# Patient Record
Sex: Female | Born: 1965
Health system: Southern US, Community
[De-identification: ages and names within clinical notes are randomized; demographics above are authoritative.]

## PROBLEM LIST (undated history)

## (undated) DIAGNOSIS — K589 Irritable bowel syndrome without diarrhea: Secondary | ICD-10-CM

## (undated) DIAGNOSIS — E559 Vitamin D deficiency, unspecified: Secondary | ICD-10-CM

## (undated) DIAGNOSIS — S82209A Unspecified fracture of shaft of unspecified tibia, initial encounter for closed fracture: Secondary | ICD-10-CM

## (undated) DIAGNOSIS — S82409A Unspecified fracture of shaft of unspecified fibula, initial encounter for closed fracture: Secondary | ICD-10-CM

## (undated) DIAGNOSIS — IMO0002 Reserved for concepts with insufficient information to code with codable children: Principal | ICD-10-CM

## (undated) HISTORY — DX: Reserved for concepts with insufficient information to code with codable children: IMO0002

## (undated) HISTORY — DX: Irritable bowel syndrome, unspecified: K58.9

## (undated) HISTORY — DX: Vitamin D deficiency, unspecified: E55.9

## (undated) HISTORY — PX: OTHER SURGICAL HISTORY: SHX169

## (undated) HISTORY — DX: Unspecified fracture of shaft of unspecified fibula, initial encounter for closed fracture: S82.409A

## (undated) HISTORY — PX: SHOULDER ARTHROSCOPY W/ ROTATOR CUFF REPAIR: SHX2400

## (undated) HISTORY — DX: Unspecified fracture of shaft of unspecified tibia, initial encounter for closed fracture: S82.209A

---
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
## 1986-02-17 HISTORY — PX: RHINOPLASTY: SUR1284

## 1998-05-02 ENCOUNTER — Other Ambulatory Visit: Admission: RE | Admit: 1998-05-02 | Discharge: 1998-05-02 | Payer: Self-pay | Admitting: *Deleted

## 1998-11-06 ENCOUNTER — Inpatient Hospital Stay (HOSPITAL_COMMUNITY): Admission: AD | Admit: 1998-11-06 | Discharge: 1998-11-09 | Payer: Self-pay | Admitting: *Deleted

## 1998-11-11 ENCOUNTER — Encounter (HOSPITAL_COMMUNITY): Admission: RE | Admit: 1998-11-11 | Discharge: 1999-02-09 | Payer: Self-pay | Admitting: *Deleted

## 1998-12-20 ENCOUNTER — Other Ambulatory Visit: Admission: RE | Admit: 1998-12-20 | Discharge: 1998-12-20 | Payer: Self-pay | Admitting: *Deleted

## 2000-06-09 ENCOUNTER — Other Ambulatory Visit: Admission: RE | Admit: 2000-06-09 | Discharge: 2000-06-09 | Payer: Self-pay | Admitting: *Deleted

## 2002-12-22 ENCOUNTER — Other Ambulatory Visit: Admission: RE | Admit: 2002-12-22 | Discharge: 2002-12-22 | Payer: Self-pay | Admitting: Obstetrics and Gynecology

## 2002-12-27 ENCOUNTER — Encounter: Admission: RE | Admit: 2002-12-27 | Discharge: 2002-12-27 | Payer: Self-pay | Admitting: Obstetrics and Gynecology

## 2004-06-17 ENCOUNTER — Ambulatory Visit: Payer: Self-pay | Admitting: Family Medicine

## 2004-07-05 ENCOUNTER — Ambulatory Visit: Payer: Self-pay | Admitting: Family Medicine

## 2005-01-17 ENCOUNTER — Ambulatory Visit: Payer: Self-pay | Admitting: Family Medicine

## 2005-07-28 ENCOUNTER — Ambulatory Visit: Payer: Self-pay | Admitting: Internal Medicine

## 2006-01-13 ENCOUNTER — Ambulatory Visit: Payer: Self-pay | Admitting: Family Medicine

## 2006-01-16 ENCOUNTER — Ambulatory Visit: Payer: Self-pay | Admitting: Family Medicine

## 2006-03-25 ENCOUNTER — Other Ambulatory Visit: Admission: RE | Admit: 2006-03-25 | Discharge: 2006-03-25 | Payer: Self-pay | Admitting: Obstetrics & Gynecology

## 2006-04-20 ENCOUNTER — Ambulatory Visit: Payer: Self-pay | Admitting: Family Medicine

## 2007-02-18 HISTORY — PX: ENDOMETRIAL ABLATION: SHX621

## 2007-03-26 ENCOUNTER — Ambulatory Visit: Payer: Self-pay | Admitting: Family Medicine

## 2007-03-26 LAB — CONVERTED CEMR LAB: Rapid Strep: NEGATIVE

## 2007-03-29 ENCOUNTER — Other Ambulatory Visit: Admission: RE | Admit: 2007-03-29 | Discharge: 2007-03-29 | Payer: Self-pay | Admitting: Obstetrics & Gynecology

## 2007-04-27 ENCOUNTER — Ambulatory Visit (HOSPITAL_BASED_OUTPATIENT_CLINIC_OR_DEPARTMENT_OTHER): Admission: RE | Admit: 2007-04-27 | Discharge: 2007-04-27 | Payer: Self-pay | Admitting: Obstetrics & Gynecology

## 2007-08-13 ENCOUNTER — Ambulatory Visit: Payer: Self-pay | Admitting: Family Medicine

## 2007-10-22 ENCOUNTER — Ambulatory Visit: Payer: Self-pay | Admitting: Family Medicine

## 2007-11-29 ENCOUNTER — Ambulatory Visit: Payer: Self-pay | Admitting: Family Medicine

## 2007-11-29 DIAGNOSIS — R5383 Other fatigue: Secondary | ICD-10-CM | POA: Insufficient documentation

## 2007-11-29 DIAGNOSIS — R5381 Other malaise: Secondary | ICD-10-CM | POA: Insufficient documentation

## 2008-04-12 ENCOUNTER — Other Ambulatory Visit: Admission: RE | Admit: 2008-04-12 | Discharge: 2008-04-12 | Payer: Self-pay | Admitting: Obstetrics & Gynecology

## 2008-04-18 ENCOUNTER — Ambulatory Visit (HOSPITAL_COMMUNITY): Admission: RE | Admit: 2008-04-18 | Discharge: 2008-04-18 | Payer: Self-pay | Admitting: Obstetrics & Gynecology

## 2008-09-14 ENCOUNTER — Ambulatory Visit: Payer: Self-pay | Admitting: Family Medicine

## 2008-09-14 DIAGNOSIS — J019 Acute sinusitis, unspecified: Secondary | ICD-10-CM | POA: Insufficient documentation

## 2009-04-17 LAB — CONVERTED CEMR LAB: Pap Smear: NORMAL

## 2009-11-28 ENCOUNTER — Ambulatory Visit: Payer: Self-pay | Admitting: Family Medicine

## 2009-11-28 LAB — CONVERTED CEMR LAB
ALT: 13 U/L
AST: 19 U/L
Albumin: 4.2 g/dL
Alkaline Phosphatase: 57 U/L
BUN: 16 mg/dL
Basophils Absolute: 0 10*3/uL
Basophils Relative: 0.8 %
Bilirubin, Direct: 0.1 mg/dL
CO2: 30 meq/L
Calcium: 9.9 mg/dL
Chloride: 106 meq/L
Cholesterol: 187 mg/dL
Creatinine, Ser: 0.9 mg/dL
Eosinophils Absolute: 0.2 10*3/uL
Eosinophils Relative: 4.1 %
GFR calc non Af Amer: 68.97 mL/min
Glucose, Bld: 97 mg/dL
HCT: 42.1 %
HDL: 44.6 mg/dL
Hemoglobin: 14.5 g/dL
LDL Cholesterol: 128 mg/dL — ABNORMAL HIGH
Lymphocytes Relative: 34 %
Lymphs Abs: 1.4 10*3/uL
MCHC: 34.4 g/dL
MCV: 92 fL
Monocytes Absolute: 0.4 10*3/uL
Monocytes Relative: 10.2 %
Neutro Abs: 2.1 10*3/uL
Neutrophils Relative %: 50.9 %
Platelets: 257 10*3/uL
Potassium: 4.5 meq/L
RBC: 4.57 M/uL
RDW: 12.6 %
Sodium: 141 meq/L
TSH: 1.2 u[IU]/mL
Total Bilirubin: 0.6 mg/dL
Total CHOL/HDL Ratio: 4
Total Protein: 7.1 g/dL
Triglycerides: 74 mg/dL
VLDL: 14.8 mg/dL
WBC: 4.1 10*3/uL — ABNORMAL LOW

## 2009-12-05 ENCOUNTER — Ambulatory Visit: Payer: Self-pay | Admitting: Family Medicine

## 2010-03-19 NOTE — Assessment & Plan Note (Signed)
Summary: CPX/DLO  R/S FROM 10/19/09   Vital Signs:  Patient profile:   45 year old female Height:      65 inches Weight:      163 pounds BMI:     27.22 Temp:     98.5 degrees F oral Pulse rate:   80 / minute Pulse rhythm:   regular BP sitting:   122 / 80  (left arm) Cuff size:   regular  Vitals Entered By: Lewanda Rife LPN (December 05, 2009 10:26 AM) CC: CPX GYN does pap   History of Present Illness: here for wellness exam and to disc chronic med problems  in general does not feel well - does not know why wonders if she is in menopause  is feeling cold and hot  is jittery and on edge in the ams , then at other times feels very emotional   mother had early menopause in 40s   wt is down 23 lb  had left over valium-- used for anxiety   works out every day   has lost significant weight  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 last -- probably needs one   flu shot - got today   labs all normal  lipids trig 74 and HDL 44 and LDL 128   Allergies: 1)  ! Iodine 2)  ! Codeine 3)  ! Claritin 4)  ! Allegra 5)  ! 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 Eyes:   Denies blurring and eye pain. CV:  Denies chest pain or discomfort, lightheadness, palpitations, shortness of breath with exertion, and swelling of feet. Resp:  Denies cough, shortness of breath, and wheezing. GI:  Denies abdominal pain, bloody stools, change in bowel habits, indigestion, loss of appetite, nausea, and vomiting. GU:  Denies discharge, dysuria, and urinary frequency. MS:  Denies joint pain, joint redness, and joint swelling. Derm:  Denies itching, lesion(s), poor wound healing, and rash. Neuro:  Denies numbness, tingling, and weakness. Psych:  Complains of anxiety, easily tearful, and irritability; denies depression, panic attacks, sense of great danger, and suicidal thoughts/plans. Endo:  Complains of heat intolerance; denies cold intolerance, excessive thirst, and excessive urination. Heme:  Denies abnormal bruising and bleeding.  Physical Exam  General:  Well-developed,well-nourished,in no acute distress; alert,appropriate and cooperative throughout examination Head:  normocephalic, atraumatic, and no abnormalities observed.   Eyes:  vision grossly intact, pupils equal, pupils round, and pupils reactive to light.  no conjunctival pallor, injection or icterus  Ears:  R ear normal and L ear normal.   Nose:  no nasal discharge.   Mouth:  pharynx pink and moist.  Neck:  supple with full rom and no masses or thyromegally, no JVD or carotid bruit  Chest Wall:  No deformities, masses, or tenderness noted. Lungs:  Normal respiratory effort, chest expands symmetrically. Lungs are clear to auscultation, no crackles or wheezes. Heart:  Normal rate and regular rhythm. S1 and S2 normal without gallop, murmur, click, rub or other extra sounds. Abdomen:  Bowel sounds positive,abdomen soft and non-tender without masses, organomegaly or hernias noted. no renal bruits no suprapubic tenderness or fullness felt  Msk:  No deformity or scoliosis noted of thoracic or lumbar spine.  no acute  joint changes  Pulses:  R and L carotid,radial,femoral,dorsalis pedis and posterior tibial pulses are full and equal bilaterally Extremities:  No clubbing, cyanosis, edema, or deformity noted with normal full range of motion of all joints.   Neurologic:  sensation intact to light touch, gait normal, and DTRs symmetrical and normal.   Skin:  Intact without suspicious lesions or rashes lentigos diffusely  Cervical Nodes:  No lymphadenopathy noted Inguinal Nodes:  No significant adenopathy Psych:  normal affect, talkative and pleasant  good eye contact and communication skills not tearful or seemingly anxious   Impression & Recommendations:  Problem # 1:  HEALTH MAINTENANCE EXAM (ICD-V70.0) Assessment Comment Only reviewed health habits including diet, exercise and skin cancer prevention reviewed health maintenance list and family history rev wellness labs in detail  disc low sat fat diet for cholesterol flu shot and Td today  Problem # 2:  FATIGUE (ICD-780.79) Assessment: Deteriorated rev symptoms in detail and with fatigue/ hot flashes/ anx/ mood change and irreg menses suspect perimenopause enc to continue good diet and exercise will f/u with gyn to disc further perhaps OC would be option or ssri-- will think about it   Complete Medication List: 1)  Ibuprofen 200 Mg Tabs (Ibuprofen) .... Otc as directed.  Other Orders: Admin 1st Vaccine (02725) Flu Vaccine 22yrs + [phone])  Patient Instructions: 1)  tetnus shot today  2)  flu shot today  3)  I think you are in "peri menopause"  4)  you can talk to your gyn about it  5)  options include - going on oral contraceptive (with permission of your gyn)  6)  also - for mood change/anxiety -- could consider a daily anxiety medicine like paxil/ buspar or zoloft  7)  let me know what you think 8)  keep up the great health habits    Orders Added: 1)  Admin 1st Vaccine [90471] 2)  Flu Vaccine 56yrs + [90658] 3)  Est. Patient 40-64  years [99396]   Immunizations Administered:  Tetanus Vaccine:    Vaccine Type: Td    Site: right deltoid    Mfr: Sanofi Pasteur    Dose: 0.5 ml    Route: IM    Given by: Lewanda Rife LPN    Exp. Date: 03/21/2011    Lot #: [id number]    VIS given: 01/05/08 version given December 05, 2009.   Immunizations Administered:  Tetanus Vaccine:    Vaccine Type: Td    Site: right deltoid    Mfr: Sanofi Pasteur    Dose: 0.5 ml    Route: IM    Given by: Lewanda Rife LPN    Exp. Date: 03/21/2011    Lot #: [id number]    VIS given: 01/05/08 version given December 05, 2009.  Current Allergies (reviewed today): ! IODINE ! CODEINE ! CLARITIN ! ALLEGRA ! * EPIDURAL PERCOCET Flu Vaccine Consent Questions  Do you have a history of severe allergic reactions to this vaccine? no    Any prior history of allergic reactions to egg and/or gelatin? no    Do you have a sensitivity to the preservative Thimersol? no    Do you have a past history of Guillan-Barre Syndrome? no    Do you currently have an acute febrile illness? no    Have you ever had a severe reaction to latex? no    Vaccine information given and explained to patient? yes    Are you currently pregnant? no    Lot Number:[id number]   Exp Date:08/17/2010   Site Given  Left Deltoid IM Lewanda Rife LPN  December 05, 2009 10:32 AM .lbflu1   Preventive Care Screening  Pap Smear:    Date:  04/17/2009    Results:  normal   Mammogram:    Date:  01/17/2009    Results:  normal      sees GYN

## 2010-07-02 NOTE — Op Note (Signed)
NAME:  Erin Cole, Erin Cole            ACCOUNT NO.:  [id number]   MEDICAL RECORD NO.:  [id number]          PATIENT TYPE:  AMB   LOCATION:  [facility]                         FACILITY:  [facility]   PHYSICIAN:  M. Leda Quail, MD  DATE OF BIRTH:  1965/03/08   DATE OF PROCEDURE:  04/27/2007  DATE OF DISCHARGE:                               OPERATIVE REPORT   PRE OPERATIVE DIAGNOSES  1. A 45 year old G2 P1 married white female with menorrhagia.  2. Small fibroids.  3. History of irritable bowel syndrome.  4. Spouse with history of vasectomy.   POSTOPERATIVE DIAGNOSES:  1. A71 year old G2 P1 married white female with menorrhagia.  2. Small fibroids.  3. History of irritable bowel syndrome.  4. Spouse with history of vasectomy.   PROCEDURE:  Novasure ablation with hysteroscopy.   SURGEON:  M. Leda Quail, MD   ASSISTANT:  OR staff.   ANESTHESIA:  MAC.   FINDINGS:  Normal-appearing cavity with a good ablation postprocedure.   SPECIMENS:  None.   ESTIMATED BLOOD LOSS:  Minimal.   FLUIDS:  1000 mL of LR.   URINE OUTPUT:  50 mL of clear urine drained with a Foley catheter at end  of the procedure.   HYSTEROSCOPIC FLUID DEFICIT:  50 mL of 3% sorbitol; however, there was a  fair amount of this that was actually on the floor.   COMPLICATIONS:  None.   INDICATIONS:  Erin Cole is a very nice 45 year old G2 P1 married  white female with a history of menorrhagia.  She is undergone workup  that included ultrasound showing several small fibroids.  She has been  on birth control pills without much success because of side effects.  She stopped these on her own.  She underwent an endometrial biopsy in  February 2009 which was normal showing benign proliferative endometrium.  She is not interested in [place] IUD and has opted for an ablation.  She  presents for this today.  Risks and benefits have explained her and  documented in our office chart.  Informed consent is present on her  chart today.   PROCEDURE:  The patient is taken to the operating room.  She is placed  in supine position.  MAC anesthesia was administered by anesthesia  without difficulty.  Legs are then positioned in the Brimson stirrups in  the high lithotomy position.  Perineum, inner and thighs and vagina were  prepped in normal sterile fashion.  Red rubber catheters was used to  drain the bladder of all urine.  The patient is then draped in normal  sterile fashion.  A bivalve speculum is placed in the vagina.  The  anterior lip of the cervix is grasped with single-tooth tenaculum.  The  uterus sounds to 10 cm.  The cervix measures at approximately 5 cm.  The  cervix was dilated up to a #5 Hegar dilator.  The 5-mm diagnostic  hysteroscope was passed in the cervical canal.  The cavity of the uterus  well visualized.  Tubal ostia noted and photo documentation is made.  There was no distortion of the cavity from the fibroids or  any other  lesions.  No polyps are noted.  The hysteroscope was removed.  Using  [facility] dilators, the cervix is dilated up to a #8.  A Novasure device is  obtained.  The array is opened outside the patient to ensure that it is  working properly.  A vacuum valve was tapped.  Then the device is passed  through the cervical canal to the fundus of the uterus.  The array is  opened within the patient.  The device is seated by moving it  anteriorly, posteriorly, to the right and to the left and twisting it 90  degrees to the right and left.  This was done several times, the cavity  width was 2.7.  At this point the cavity assessment test was performed  after the cervical seal was placed.  This was passed without difficulty.  Then the ablation cycle was performed.  The ablation cycle lasted 1  minute 57 seconds and the ablation was performed at the power of 74.  With ablation cycle was complete, the array was closed, the device was  removed from the patient.  At a 5-mm hysteroscope was  then by placed  back through the cervical canal and the cavity was visualized.  Good  ablation was performed.  A single-tooth tenaculum was on the anterior  lip of the cervix through the entire procedure to help stabilize the  uterus.  This was removed at this point and no bleeding was noted from  the tenaculum sites.  Please note that at the beginning of the procedure  prior to any dilation of the cervix and after the application of the  tenaculum to the anterior lip of cervix, a paracervical block with 1%  lidocaine without epinephrine was placed.  10 mL in total were placed at  the 3, 6, 9 and 12 o'clock positions on the cervix.  At this point in  the procedure, the patient was awakened from anesthesia.  She was in  stable condition with good pain control and she was taken to the  recovery room in stable condition.      Erin Keas, MD  Electronically Signed     MSM/MEDQ  D:  04/27/2007  T:  04/28/2007  Job:  [id number]

## 2010-11-11 ENCOUNTER — Ambulatory Visit: Payer: Self-pay | Admitting: Internal Medicine

## 2010-11-11 LAB — CBC
HCT: 38.4
Hemoglobin: 13.4
MCHC: 35.1
MCV: 88.5
Platelets: 276
RBC: 4.33
RDW: 12.8
WBC: 10.7 — ABNORMAL HIGH

## 2010-11-11 LAB — URINALYSIS, MICROSCOPIC ONLY
Bilirubin Urine: NEGATIVE
Glucose, UA: NEGATIVE
Ketones, ur: NEGATIVE
Leukocytes, UA: NEGATIVE
Nitrite: NEGATIVE
Protein, ur: NEGATIVE
Specific Gravity, Urine: 1.024
Urobilinogen, UA: 0.2
pH: 5.5

## 2010-11-11 LAB — PREGNANCY, URINE: Preg Test, Ur: NEGATIVE

## 2010-11-29 ENCOUNTER — Ambulatory Visit (INDEPENDENT_AMBULATORY_CARE_PROVIDER_SITE_OTHER): Payer: BC Managed Care – PPO | Admitting: Internal Medicine

## 2010-11-29 ENCOUNTER — Encounter: Payer: Self-pay | Admitting: Internal Medicine

## 2010-11-29 VITALS — BP 121/79 | HR 74 | Temp 98.3°F | Resp 16 | Ht 67.0 in | Wt 169.0 lb

## 2010-11-29 DIAGNOSIS — R5381 Other malaise: Secondary | ICD-10-CM

## 2010-11-29 DIAGNOSIS — Z13220 Encounter for screening for lipoid disorders: Secondary | ICD-10-CM

## 2010-11-29 DIAGNOSIS — R232 Flushing: Secondary | ICD-10-CM

## 2010-11-29 DIAGNOSIS — Z23 Encounter for immunization: Secondary | ICD-10-CM

## 2010-11-29 DIAGNOSIS — D72819 Decreased white blood cell count, unspecified: Secondary | ICD-10-CM

## 2010-11-29 DIAGNOSIS — N951 Menopausal and female climacteric states: Secondary | ICD-10-CM

## 2010-11-29 DIAGNOSIS — R5383 Other fatigue: Secondary | ICD-10-CM

## 2010-11-29 NOTE — Patient Instructions (Signed)
Will are scheduling you for a trasvaginal ultrasound at [facility].  Return to [place] for fasting labs at your convenience
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
## 2010-12-01 ENCOUNTER — Encounter: Payer: Self-pay | Admitting: Internal Medicine

## 2010-12-01 DIAGNOSIS — Z131 Encounter for screening for diabetes mellitus: Secondary | ICD-10-CM | POA: Insufficient documentation

## 2010-12-01 DIAGNOSIS — Z1231 Encounter for screening mammogram for malignant neoplasm of breast: Secondary | ICD-10-CM | POA: Insufficient documentation

## 2010-12-01 DIAGNOSIS — Z13220 Encounter for screening for lipoid disorders: Secondary | ICD-10-CM | POA: Insufficient documentation

## 2010-12-01 NOTE — Assessment & Plan Note (Signed)
She will return for fasting lipids, thyreodi and diabetes screen

## 2011-04-18 ENCOUNTER — Ambulatory Visit: Payer: BC Managed Care – PPO | Admitting: Internal Medicine

## 2011-11-11 ENCOUNTER — Ambulatory Visit (INDEPENDENT_AMBULATORY_CARE_PROVIDER_SITE_OTHER): Payer: BC Managed Care – PPO | Admitting: Internal Medicine

## 2011-11-11 ENCOUNTER — Encounter: Payer: Self-pay | Admitting: Internal Medicine

## 2011-11-11 VITALS — BP 104/62 | HR 84 | Temp 98.7°F | Resp 16 | Wt 173.0 lb

## 2011-11-11 DIAGNOSIS — B3731 Acute candidiasis of vulva and vagina: Secondary | ICD-10-CM | POA: Insufficient documentation

## 2011-11-11 DIAGNOSIS — R232 Flushing: Secondary | ICD-10-CM

## 2011-11-11 DIAGNOSIS — N951 Menopausal and female climacteric states: Secondary | ICD-10-CM

## 2011-11-11 DIAGNOSIS — R5383 Other fatigue: Secondary | ICD-10-CM

## 2011-11-11 DIAGNOSIS — B373 Candidiasis of vulva and vagina: Secondary | ICD-10-CM | POA: Insufficient documentation

## 2011-11-11 DIAGNOSIS — J029 Acute pharyngitis, unspecified: Secondary | ICD-10-CM | POA: Insufficient documentation

## 2011-11-11 DIAGNOSIS — B084 Enteroviral vesicular stomatitis with exanthem: Secondary | ICD-10-CM

## 2011-11-11 DIAGNOSIS — R5381 Other malaise: Secondary | ICD-10-CM

## 2011-11-11 LAB — CBC WITH DIFFERENTIAL/PLATELET
Basophils Absolute: 0 10*3/uL (ref 0.0–0.1)
Basophils Relative: 0.3 % (ref 0.0–3.0)
Eosinophils Absolute: 0 10*3/uL (ref 0.0–0.7)
Eosinophils Relative: 0.4 % (ref 0.0–5.0)
HCT: 41.3 % (ref 36.0–46.0)
Hemoglobin: 13.7 g/dL (ref 12.0–15.0)
Lymphocytes Relative: 9.4 % — ABNORMAL LOW (ref 12.0–46.0)
Lymphs Abs: 0.7 10*3/uL (ref 0.7–4.0)
MCHC: 33.1 g/dL (ref 30.0–36.0)
MCV: 92.1 fl (ref 78.0–100.0)
Monocytes Absolute: 0.8 10*3/uL (ref 0.1–1.0)
Monocytes Relative: 11 % (ref 3.0–12.0)
Neutro Abs: 5.5 10*3/uL (ref 1.4–7.7)
Neutrophils Relative %: 78.9 % — ABNORMAL HIGH (ref 43.0–77.0)
Platelets: 240 10*3/uL (ref 150.0–400.0)
RBC: 4.48 Mil/uL (ref 3.87–5.11)
RDW: 13.2 % (ref 11.5–14.6)
WBC: 7 10*3/uL (ref 4.5–10.5)

## 2011-11-11 LAB — POCT RAPID STREP A (OFFICE): Rapid Strep A Screen: NEGATIVE

## 2011-11-11 MED ORDER — FLUCONAZOLE 150 MG PO TABS: 150.0000 mg | ORAL_TABLET | Freq: Every day | ORAL | Status: DC

## 2011-11-11 MED ORDER — AMOXICILLIN-POT CLAVULANATE 875-125 MG PO TABS: 1.0000 | ORAL_TABLET | Freq: Two times a day (BID) | ORAL | Status: DC

## 2011-11-11 NOTE — Assessment & Plan Note (Signed)
Exam is consistent with early of hand foot mouth disease. Handout given. Continue Tylenol and Motrin as needed. Rapid strep was negative. Asked patient to start Augmentin only if she develops fevers over 100.4, severe facial pain or ear pain, or productive cough with pleuritic chest pain.

## 2011-11-11 NOTE — Addendum Note (Signed)
Addended by: Darletta Moll A on: 11/11/2011 02:00 PM   Modules accepted: Orders

## 2011-11-11 NOTE — Assessment & Plan Note (Signed)
Rapid strep done per patient request due to histoyr of Strep last march and recurrent symptoms.

## 2011-11-11 NOTE — Assessment & Plan Note (Signed)
Empiric fluconazole x2 days prescribed. If symptoms do not resolve she will return for a pelvic exam.

## 2011-11-11 NOTE — Patient Instructions (Signed)
Hand, Foot, and Mouth Disease  Hand, foot, and mouth disease is a common viral illness. It occurs mainly in children younger than 45 years of age, but adolescents and adults may also get it. This disease is different than foot and mouth disease that cattle, sheep, and pigs get. Most people are better in 1 week.  CAUSES   Hand, foot, and mouth disease is usually caused by a group of viruses called enteroviruses. Hand, foot, and mouth disease can spread from person to person (contagious). A person is most contagious during the first week of the illness. It is not transmitted to or from pets or other animals. It is most common in the summer and early fall. Infection is spread from person to person by direct contact with an infected person's:   Nose discharge.   Throat discharge.   Stool.  SYMPTOMS   Open sores (ulcers) occur in the mouth. Symptoms may also include:   A rash on the hands and feet, and occasionally the buttocks.   Fever.   Aches.   Pain from the mouth ulcers.   Fussiness.  DIAGNOSIS   Hand, foot, and mouth disease is one of many infections that cause mouth sores. To be certain your child has hand, foot, and mouth disease your caregiver will diagnose your child by physical exam.Additional tests are not usually needed.  TREATMENT   Nearly all patients recover without medical treatment in 7 to 10 days. There are no common complications. Your child should only take over-the-counter or prescription medicines for pain, discomfort, or fever as directed by your caregiver. Your caregiver may recommend the use of an over-the-counter antacid or a combination of an antacid and diphenhydramine to help coat the lesions in the mouth and improve symptoms.   HOME CARE INSTRUCTIONS   Try combinations of foods to see what your child will tolerate and aim for a balanced diet. Soft foods may be easier to swallow. The mouth sores from hand, foot, and mouth disease typically hurt and are painful when exposed to  salty, spicy, or acidic food or drinks.   Milk and cold drinks are soothing for some patients. Milk shakes, frozen ice pops, slushies, and sherberts are usually well tolerated.   Sport drinks are good choices for hydration, and they also provide a few calories. Often, a child with hand, foot, and mouth disease will be able to drink without discomfort.    For younger children and infants, feeding with a cup, spoon, or syringe may be less painful than drinking through the nipple of a bottle.   Keep children out of childcare programs, schools, or other group settings during the first few days of the illness or until they are without fever. The sores on the body are not contagious.  SEEK IMMEDIATE MEDICAL CARE IF:   Your child develops signs of dehydration such as:   Decreased urination.   Dry mouth, tongue, or lips.   Decreased tears or sunken eyes.   Dry skin.   Rapid breathing.   Fussy behavior.   Poor color or pale skin.   Fingertips taking longer than 2 seconds to turn pink after a gentle squeeze.   Rapid weight loss.   Your child does not have adequate pain relief.   Your child develops a severe headache, stiff neck, or change in behavior.   Your child develops ulcers or blisters that occur on the lips or outside of the mouth.  Document Released: 11/02/2002 Document Revised: 01/23/2011 Document Reviewed: 07/18/2010    LL[employer]. 
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
## 2011-11-12 LAB — COMPREHENSIVE METABOLIC PANEL
ALT: 18 U/L (ref 0–35)
AST: 24 U/L (ref 0–37)
Albumin: 4.3 g/dL (ref 3.5–5.2)
Alkaline Phosphatase: 60 U/L (ref 39–117)
BUN: 12 mg/dL (ref 6–23)
CO2: 25 mEq/L (ref 19–32)
Calcium: 9.4 mg/dL (ref 8.4–10.5)
Chloride: 104 mEq/L (ref 96–112)
Creatinine, Ser: 1 mg/dL (ref 0.4–1.2)
GFR: 67.52 mL/min (ref >60.00)
Glucose, Bld: 89 mg/dL (ref 70–99)
Potassium: 4 mEq/L (ref 3.5–5.1)
Sodium: 138 mEq/L (ref 135–145)
Total Bilirubin: 0.6 mg/dL (ref 0.3–1.2)
Total Protein: 7.5 g/dL (ref 6.0–8.3)

## 2011-11-12 LAB — LIPID PANEL
Cholesterol: 205 mg/dL — ABNORMAL HIGH (ref 0–200)
HDL: 51.5 mg/dL (ref >39.00)
Total CHOL/HDL Ratio: 4
Triglycerides: 87 mg/dL (ref 0.0–149.0)
VLDL: 17.4 mg/dL (ref 0.0–40.0)

## 2011-11-12 LAB — LDL CHOLESTEROL, DIRECT: Direct LDL: 130.7 mg/dL

## 2011-11-12 LAB — LUTEINIZING HORMONE: LH: 53.27 m[IU]/mL

## 2011-11-12 LAB — TSH: TSH: 1.24 u[IU]/mL (ref 0.35–5.50)

## 2011-11-12 LAB — FOLLICLE STIMULATING HORMONE: FSH: 69.3 m[IU]/mL

## 2011-11-17 ENCOUNTER — Telehealth: Payer: Self-pay | Admitting: Internal Medicine

## 2011-11-17 MED ORDER — FLUCONAZOLE 150 MG PO TABS: 150.0000 mg | ORAL_TABLET | Freq: Every day | ORAL | Status: DC

## 2011-11-17 NOTE — Telephone Encounter (Signed)
2 more days of fluconzole sent to pharmacy ,  But if discharge has stopped she does not need any additional after this

## 2011-11-17 NOTE — Telephone Encounter (Signed)
Caller: [name]/Patient; Patient Name: Erin Cole; PCP: Duncan Dull (Adults only); Best Callback Phone Number: [phone]   11-17-11 called this morning requesting a Rx for Diflucan but states hasn't heard anything yet.  I looked in Epic and told her it was in process of being dealt with but nothing ordered yet.

## 2011-11-17 NOTE — Addendum Note (Signed)
Addended by: Duncan Dull on: 11/17/2011 04:58 PM   Modules accepted: Orders

## 2011-11-17 NOTE — Telephone Encounter (Signed)
Caller: Drena/Patient; Patient Name: Erin Cole; PCP: Duncan Dull (Adults only); Best Callback Phone Number: [phone] Temp-  at work no thermometer. Pt states she was in office last week for and dx with Hand foot mouth and vagianl yeast infection. She states that mouth sores are better from Hand foot mouth. She is requesting a prescription for Fluconazole. She reports she developed a fever so she was instructed to start antibiotics if she did start a fever. She took antibiotics Thurs.- Sat and then stopped it because the yeast infection was getting so bad. She used Fluconazole Tues and Wed. She c/o of vaginal  redness,  irritation and some , itching, denies  discharge.  Emergent s/s of of Hand Foot Mouth Dx or suspected protocol r/o. Emergent s/s of Vaginal Discharge or Irritation protocol r/o. Pt to see provider within 24hrs. Offered pt 4:15 appointmnet today. Pt is requesting a message to doctor requesting another prescription for Fluconazole or something for yeast. Pharmacy is [place].

## 2012-07-20 ENCOUNTER — Other Ambulatory Visit: Payer: Self-pay | Admitting: *Deleted

## 2012-07-20 MED ORDER — CONJ ESTROG-MEDROXYPROGEST ACE 0.45-1.5 MG PO TABS: 1.0000 | ORAL_TABLET | Freq: Every day | ORAL | Status: DC

## 2012-07-20 NOTE — Telephone Encounter (Signed)
Faxed refill request received from pharmacy for [facility] Last filled by MD on 07/18/11, 30 X 1 YEAR Last AEX - 07/16/11 Next AEX - 08/12/12 RX sent to last until AEX.

## 2012-08-10 ENCOUNTER — Encounter: Payer: Self-pay | Admitting: Obstetrics & Gynecology

## 2012-08-12 ENCOUNTER — Ambulatory Visit: Payer: Self-pay | Admitting: Obstetrics & Gynecology

## 2012-08-16 ENCOUNTER — Ambulatory Visit (INDEPENDENT_AMBULATORY_CARE_PROVIDER_SITE_OTHER): Payer: BC Managed Care – PPO | Admitting: Obstetrics & Gynecology

## 2012-08-16 ENCOUNTER — Encounter: Payer: Self-pay | Admitting: Obstetrics & Gynecology

## 2012-08-16 VITALS — BP 124/76 | HR 64 | Resp 16 | Ht 65.75 in | Wt 178.6 lb

## 2012-08-16 DIAGNOSIS — Z01419 Encounter for gynecological examination (general) (routine) without abnormal findings: Secondary | ICD-10-CM

## 2012-08-16 DIAGNOSIS — R87619 Unspecified abnormal cytological findings in specimens from cervix uteri: Secondary | ICD-10-CM

## 2012-08-16 DIAGNOSIS — Z124 Encounter for screening for malignant neoplasm of cervix: Secondary | ICD-10-CM

## 2012-08-16 DIAGNOSIS — R87810 Cervical high risk human papillomavirus (HPV) DNA test positive: Secondary | ICD-10-CM

## 2012-08-16 DIAGNOSIS — IMO0002 Reserved for concepts with insufficient information to code with codable children: Secondary | ICD-10-CM

## 2012-08-16 HISTORY — DX: Unspecified abnormal cytological findings in specimens from cervix uteri: R87.619

## 2012-08-16 HISTORY — DX: Reserved for concepts with insufficient information to code with codable children: IMO0002

## 2012-08-16 MED ORDER — ESTRADIOL-NORETHINDRONE ACET 0.05-0.25 MG/DAY TD PTTW: 1.0000 | MEDICATED_PATCH | TRANSDERMAL | Status: DC
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
## 2012-08-16 NOTE — Patient Instructions (Signed)

## 2012-08-17 NOTE — Addendum Note (Signed)
Addended by: Jerene Bears on: 08/17/2012 11:36 AM   Modules accepted: Orders

## 2012-08-23 LAB — IPS PAP TEST WITH HPV

## 2012-08-24 ENCOUNTER — Ambulatory Visit: Payer: Self-pay

## 2012-08-26 NOTE — Addendum Note (Signed)
Addended by: Jerene Bears on: 08/26/2012 02:53 PM   Modules accepted: Orders

## 2012-08-31 ENCOUNTER — Telehealth: Payer: Self-pay | Admitting: *Deleted

## 2012-08-31 LAB — IPS HPV GENOTYPING 16/18

## 2012-08-31 NOTE — Telephone Encounter (Signed)
Message copied by Alisa Graff on Tue Aug 31, 2012  3:44 PM ------      Message from: Jerene Bears      Created: Tue Aug 31, 2012 10:21 AM       Please call.  Pap nl.  hpv positive and, again, a high risk subtype is positive.  Needs colpo.  Pt came early for aex and waited as well so was very unhappy.  If there is anyway appt can be first of morning or first of afternoon appt or end of day, please make it so. ------

## 2012-08-31 NOTE — Telephone Encounter (Signed)
LM on cell number, no specifics.  Call to work number and was told she is out of town this week, no message left.

## 2012-09-01 ENCOUNTER — Telehealth: Payer: Self-pay | Admitting: *Deleted

## 2012-09-01 DIAGNOSIS — IMO0002 Reserved for concepts with insufficient information to code with codable children: Secondary | ICD-10-CM

## 2012-09-01 NOTE — Telephone Encounter (Signed)
Patient notified of pap and HPV results. Discussed that Dr Hyacinth Meeker recommends colpo. Patient agreeable, denies questions as she has had before. Husband with vasectomy.  Colpo scheduled for 09-15-12. Instructed to take Motrin 800 mg 1 hr prior to procedure with food.

## 2012-09-07 NOTE — Telephone Encounter (Signed)
See phone note 09/01/12 

## 2012-09-09 ENCOUNTER — Ambulatory Visit: Payer: Self-pay

## 2012-09-15 ENCOUNTER — Encounter: Payer: Self-pay | Admitting: Obstetrics & Gynecology

## 2012-09-15 ENCOUNTER — Ambulatory Visit (INDEPENDENT_AMBULATORY_CARE_PROVIDER_SITE_OTHER): Payer: BC Managed Care – PPO | Admitting: Obstetrics & Gynecology

## 2012-09-15 VITALS — BP 120/68 | HR 80 | Resp 20 | Ht 65.5 in | Wt 180.0 lb

## 2012-09-15 DIAGNOSIS — R87810 Cervical high risk human papillomavirus (HPV) DNA test positive: Secondary | ICD-10-CM

## 2012-09-15 DIAGNOSIS — B977 Papillomavirus as the cause of diseases classified elsewhere: Secondary | ICD-10-CM

## 2012-09-15 DIAGNOSIS — IMO0002 Reserved for concepts with insufficient information to code with codable children: Secondary | ICD-10-CM

## 2012-09-15 DIAGNOSIS — R6889 Other general symptoms and signs: Secondary | ICD-10-CM

## 2012-09-15 NOTE — Patient Instructions (Signed)

## 2012-09-15 NOTE — Progress Notes (Signed)
Subjective:     Patient ID: Erin Cole, female   DOB: 26-Aug-1965, 47 y.o.   MRN: [id number]  HPI Very nice 47 yo G2P1 MWF with h/o + HR HPV (+16 subtype) and neg Pap, now for two years.  Colpo last year was negative.  ECC negative last year.  Same Pap results obtained this year.  Here for colpo again.  LMP 08/18/10.  Contraception: vasectomy  Review of Systems  All other systems reviewed and are negative.       Objective:   Physical Exam  Genitourinary:       Blood pressure 120/68, pulse 80, resp. rate 20, height 5' 5.5" (1.664 m), weight 180 lb (81.647 kg), last menstrual period 08/18/2010.  Procedure explained and patient's questions were invited and answered.  Consent form signed.   Role of HPV in genesis of SIL discussed with patient, and questions answered.   Speculum inserted atraumatically and cervix visualized.  3% acetic acid applied.  Cervix examined using 3.75 and 7.5   X magnification and green filter.  No lesions noted.  Endocervical TZ noted.  No Lugol's noted due to Iodine allergy.  Cervical dilation of cervix required with Milex dilator.  TZ could be visualized at this point.  ECC obtained.    Patient tolerated procedure well.     Assessment:     Normal Pap with + HR HPV, Subtype 16     Plan:     Plan will be made pending pathology results.  ECC pending.

## 2012-09-15 NOTE — Addendum Note (Signed)
Addended by: Jerene Bears on: 09/15/2012 09:42 AM   Modules accepted: Orders

## 2012-09-17 LAB — IPS CERVICAL/ECC/EMB/VULVAR/VAGINAL BIOPSY

## 2012-09-21 ENCOUNTER — Telehealth: Payer: Self-pay

## 2012-09-21 NOTE — Telephone Encounter (Signed)
8/4 lmtcb//kn 

## 2012-09-21 NOTE — Telephone Encounter (Signed)
Message copied by Elisha Headland on Tue Sep 21, 2012 10:44 AM ------      Message from: Jerene Bears      Created: Fri Sep 17, 2012  2:33 PM       Inform ecc neg.  Repeat pap and hpv 1 yr.  08 recall.  Make sure has appt in one yr, not longer.  Thanks. ------

## 2012-09-21 NOTE — Telephone Encounter (Signed)
Pt returning your call. Says she could barely hear you.

## 2012-09-21 NOTE — Telephone Encounter (Signed)
Patient returning Kelly's call. °

## 2012-10-01 NOTE — Telephone Encounter (Signed)
Patient aware of results. Appointment for next year made and 08 recall in epic.

## 2012-11-08 ENCOUNTER — Ambulatory Visit (INDEPENDENT_AMBULATORY_CARE_PROVIDER_SITE_OTHER): Payer: BC Managed Care – PPO | Admitting: Adult Health

## 2012-11-08 ENCOUNTER — Encounter: Payer: Self-pay | Admitting: Adult Health

## 2012-11-08 VITALS — BP 108/66 | HR 69 | Temp 98.2°F | Resp 12 | Wt 181.0 lb

## 2012-11-08 DIAGNOSIS — J40 Bronchitis, not specified as acute or chronic: Secondary | ICD-10-CM | POA: Insufficient documentation

## 2012-11-08 DIAGNOSIS — J209 Acute bronchitis, unspecified: Secondary | ICD-10-CM

## 2012-11-08 MED ORDER — DOXYCYCLINE MONOHYDRATE 100 MG PO TABS: 100.0000 mg | ORAL_TABLET | Freq: Two times a day (BID) | ORAL | Status: DC

## 2012-11-08 NOTE — Progress Notes (Signed)
  Subjective:    Patient ID: Erin Cole, female    DOB: 04-06-1965, 47 y.o.   MRN: [id number]  HPI  Pt is pleasant 47 yo female, presents to clinic with chest congestion since Thursday. Pt states she initially had sore throat but that only last one day.  Pt states she began having productive cough the next day with yellow sputum.  Pt states she had a low grade fever of 100 on Saturday. Pt states she had some Doxycycline left over from when she had similar symptoms in March so she began taking it Friday night.  Pt states she is beginning to feel better so she thinks the doxycycline is working but she only had enough for 3 days.  Pt states she has been taking Ibuprofen for fever and robitussin for cough which is helping.  Pt also reports bilateral ear pressure, denies facial pain or headache.    Review of Systems  Constitutional: Positive for fever and chills.  HENT: Positive for sore throat.   Eyes: Negative.   Respiratory: Positive for cough.        Productive cough with yellow phlegm.  Cardiovascular: Negative.   Skin: Negative.   Psychiatric/Behavioral: Negative.        Objective:   Physical Exam  Constitutional: She is oriented to person, place, and time. She appears well-developed and well-nourished.  HENT:  Right Ear: Tympanic membrane normal.  Left Ear: Tympanic membrane normal.  Mouth/Throat: Oropharynx is clear and moist and mucous membranes are normal.  Right ear canal slightly erythematous  Neck: Normal range of motion. Neck supple.  Cardiovascular: Normal rate, regular rhythm and normal heart sounds.   Pulmonary/Chest: Effort normal and breath sounds normal. No respiratory distress. She has no wheezes. She has no rales.  Neurological: She is alert and oriented to person, place, and time.  Skin: Skin is warm and dry.  Psychiatric: She has a normal mood and affect. Her behavior is normal. Judgment and thought content normal.    BP 108/66  Pulse 69  Temp(Src) 98.2  F (36.8 C) (Oral)  Resp 12  Wt 181 lb (82.101 kg)  BMI 29.65 kg/m2  SpO2 98%     Assessment & Plan:

## 2012-11-08 NOTE — Assessment & Plan Note (Signed)
Patient had left over doxycycline (3 days worth) from previous illness and began taking this on Friday. Will provide with 4 more days of doxy. RTC if no improvement within 3-4 days. Advised to complete course of antibiotic despite feeling improvement.

## 2012-12-23 ENCOUNTER — Other Ambulatory Visit: Payer: Self-pay

## 2013-02-18 ENCOUNTER — Telehealth: Payer: Self-pay | Admitting: Internal Medicine

## 2013-02-18 MED ORDER — OSELTAMIVIR PHOSPHATE 75 MG PO CAPS: 75.0000 mg | ORAL_CAPSULE | Freq: Every day | ORAL | Status: DC

## 2013-02-18 NOTE — Telephone Encounter (Signed)
[facility] 1 tablet daily x 7 day s

## 2013-02-18 NOTE — Telephone Encounter (Signed)
The patient's daughter and husband have tested positive for the flu . She is wanting Tamiflu called into the pharmacy. The patient currently is not showing any symptoms.

## 2013-02-21 NOTE — Telephone Encounter (Signed)
Pt notified, had picked up Rx Friday.

## 2013-02-23 ENCOUNTER — Telehealth: Payer: Self-pay | Admitting: *Deleted

## 2013-02-23 NOTE — Telephone Encounter (Signed)
Patient husband and daughter has been diagnosed with flu and patient wanted to know if you would give a

## 2013-02-23 NOTE — Telephone Encounter (Signed)
Patient husband and daughter have both been diagnosed with flu patient stated she is not having any symptoms at this time but would like a script for tamiflu please advise.

## 2013-02-23 NOTE — Telephone Encounter (Signed)
Yes, this was done on January 2nd  9see chart) since she is not having any symptoms, the preventive dose of tamiflu is once daily.

## 2013-05-13 ENCOUNTER — Telehealth: Payer: Self-pay | Admitting: Obstetrics & Gynecology

## 2013-05-13 NOTE — Telephone Encounter (Signed)
Patient was to talk about switching her hrt from the patch back to the pill.

## 2013-05-13 NOTE — Telephone Encounter (Signed)
Spoke with patient. Patient has previously been on Prempro but switched to Combipatch. States she has been switching patch locations but it is causing skin irritation. Would like to switch back to Prempro.    Dr.Miller, okay to fill til next annual?

## 2013-05-17 MED ORDER — CONJ ESTROG-MEDROXYPROGEST ACE 0.45-1.5 MG PO TABS: 1.0000 | ORAL_TABLET | Freq: Every day | ORAL | Status: DC

## 2013-05-17 NOTE — Telephone Encounter (Signed)
RX done for prempro 0.45/1.5mg .  #30 with RFs.  Please let us know if this doesn't work well for her.  Thanks.

## 2013-05-17 NOTE — Telephone Encounter (Signed)
Spoke with patient. Advised prescription for prempro 0.45/1.5mg  sent to pharmacy. Instructed to call back to let us know if this change does not work well for her. Patient verbalizes understanding and is agreeable.  Routing to provider for final review. Patient agreeable to disposition. Will close encounter

## 2013-05-30 ENCOUNTER — Ambulatory Visit (INDEPENDENT_AMBULATORY_CARE_PROVIDER_SITE_OTHER): Payer: BC Managed Care – PPO | Admitting: Internal Medicine

## 2013-05-30 ENCOUNTER — Encounter: Payer: Self-pay | Admitting: Internal Medicine

## 2013-05-30 VITALS — BP 120/70 | HR 62 | Temp 98.6°F | Resp 18 | Wt 190.5 lb

## 2013-05-30 DIAGNOSIS — J111 Influenza due to unidentified influenza virus with other respiratory manifestations: Secondary | ICD-10-CM | POA: Insufficient documentation

## 2013-05-30 DIAGNOSIS — R509 Fever, unspecified: Secondary | ICD-10-CM

## 2013-05-30 LAB — POC INFLUENZA A&B (BINAX/QUICKVUE)
INFLUENZA A, POC: NEGATIVE
INFLUENZA B, POC: NEGATIVE

## 2013-05-30 MED ORDER — OSELTAMIVIR PHOSPHATE 75 MG PO CAPS: 75.0000 mg | ORAL_CAPSULE | Freq: Every day | ORAL | Status: DC

## 2013-05-30 MED ORDER — OSELTAMIVIR PHOSPHATE 75 MG PO CAPS: 75.0000 mg | ORAL_CAPSULE | Freq: Two times a day (BID) | ORAL | Status: DC

## 2013-05-30 MED ORDER — PREDNISONE (PAK) 10 MG PO TABS: ORAL_TABLET | ORAL | Status: DC
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
## 2013-05-30 NOTE — Assessment & Plan Note (Addendum)
Despite the negative rapid influenza test,  Will treat based on history, clinical exam and probability.  Adding prednisoen in 2 days if no improvement.

## 2013-05-30 NOTE — Patient Instructions (Addendum)
Your rapid influenza test was negative, but given your exposure and your severe symptoms, I am treating you for the flu with Tamiflue  Start it tonight.   You can use 800 mg ibuprofen every 8 hours and 500 mg tylenol every 8 hours as well for  the achiness and fevers  Add the prednisone and stop the ibuprofen in 24 hours if you don't feel  signficantly better

## 2013-09-22 ENCOUNTER — Ambulatory Visit: Payer: BC Managed Care – PPO | Admitting: Obstetrics & Gynecology

## 2013-10-14 ENCOUNTER — Ambulatory Visit (INDEPENDENT_AMBULATORY_CARE_PROVIDER_SITE_OTHER): Payer: BC Managed Care – PPO | Admitting: Obstetrics and Gynecology

## 2013-10-14 ENCOUNTER — Encounter: Payer: Self-pay | Admitting: Obstetrics and Gynecology

## 2013-10-14 VITALS — BP 118/70 | HR 64 | Resp 18 | Ht 66.0 in | Wt 189.0 lb

## 2013-10-14 DIAGNOSIS — Z0000 Encounter for general adult medical examination without abnormal findings: Secondary | ICD-10-CM

## 2013-10-14 DIAGNOSIS — Z01419 Encounter for gynecological examination (general) (routine) without abnormal findings: Secondary | ICD-10-CM

## 2013-10-14 LAB — POCT URINALYSIS DIPSTICK
Bilirubin, UA: NEGATIVE
Blood, UA: NEGATIVE
Glucose, UA: NEGATIVE
KETONES UA: NEGATIVE
Leukocytes, UA: NEGATIVE
Nitrite, UA: NEGATIVE
PROTEIN UA: NEGATIVE
Urobilinogen, UA: NEGATIVE
pH, UA: 5

## 2013-10-14 MED ORDER — CONJ ESTROG-MEDROXYPROGEST ACE 0.45-1.5 MG PO TABS: 1.0000 | ORAL_TABLET | Freq: Every day | ORAL | Status: DC

## 2013-10-14 NOTE — Patient Instructions (Signed)
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
## 2013-10-20 LAB — IPS PAP TEST WITH HPV

## 2013-11-14 ENCOUNTER — Other Ambulatory Visit: Payer: Self-pay | Admitting: Obstetrics and Gynecology

## 2013-11-14 ENCOUNTER — Telehealth: Payer: Self-pay | Admitting: Obstetrics & Gynecology

## 2013-11-14 MED ORDER — ESTRADIOL 0.5 MG PO TABS: 0.5000 mg | ORAL_TABLET | Freq: Every day | ORAL | Status: DC

## 2013-11-14 MED ORDER — MEDROXYPROGESTERONE ACETATE 2.5 MG PO TABS: 2.5000 mg | ORAL_TABLET | Freq: Every day | ORAL | Status: DC

## 2013-11-14 NOTE — Telephone Encounter (Signed)
I reviewed the patient's preferred list, and Estrace generic oral tabs are Tier I.  I will prescribe for Estradiol 0.5 mg daily.  The Progesterone medications are not listed, but I expect the Provera generic oral tabs are a low Tier as well.  I will prescribe for medroxyprogesterone acetate 2.5 mg daily.   I will put in an order for these until due for the next annual exam.   Patient will need to return for a consultation for Xanax.  No Rx for this in 3 and 1/2 years.   Thanks.

## 2013-11-14 NOTE — Telephone Encounter (Addendum)
Spoke with patient.  We attempted to find hormone information in her formulary that she had available, but unable to assist patient over the phone with this. She will fax what she has of formulary over for review.   Reviewed hard copy of chart. Xanax 0.5 mg po, should not take daily, #30 no refills from Dr. Hyacinth Meeker on 03/30/10.   Received fax from patient with formulary information.  Patient requests medication from Tier one of formulary. Paper chart and copy of faxed formulary to Dr. Edward Jolly (as covering).

## 2013-11-14 NOTE — Telephone Encounter (Signed)
Pt is calling to see if she can change her rx for prempro. Said that it isnt on her formulary. Pt also asking for another rx for xanax. York Spaniel it has been more than a year since she had it. Pt states she mentioned it to the nurse when she was in a few weeks ago but didn't speak with doc about it.

## 2013-11-15 NOTE — Telephone Encounter (Signed)
Message left to return call to Arie Gable at [phone].    

## 2013-11-15 NOTE — Telephone Encounter (Signed)
Spoke with patient. She is given message from Dr. Edward JollySilva.  Advised of new medication and importance to take both estrogen and progesterone daily.   Advised will need appointment with Dr. Hyacinth MeekerMiller for medication refill of xanax. Patient agreeable to this. Scheduled for 12/16/13 at 0830. Routing to provider for final review. Patient agreeable to disposition. Will close encounter

## 2013-12-02 ENCOUNTER — Other Ambulatory Visit: Payer: Self-pay

## 2013-12-08 ENCOUNTER — Encounter: Payer: Self-pay | Admitting: Obstetrics & Gynecology

## 2013-12-08 ENCOUNTER — Ambulatory Visit (INDEPENDENT_AMBULATORY_CARE_PROVIDER_SITE_OTHER): Payer: No Typology Code available for payment source | Admitting: Obstetrics & Gynecology

## 2013-12-08 VITALS — BP 102/60 | HR 68 | Resp 18 | Ht 66.0 in | Wt 193.0 lb

## 2013-12-08 DIAGNOSIS — Z79899 Other long term (current) drug therapy: Secondary | ICD-10-CM

## 2013-12-08 MED ORDER — ALPRAZOLAM 0.5 MG PO TABS: 0.5000 mg | ORAL_TABLET | Freq: Every evening | ORAL | Status: DC | PRN

## 2013-12-11 NOTE — Progress Notes (Signed)
Patient ID: Erin HagemanSusan L Ungerer, female   DOB: 1965-07-05, 48 y.o.   MRN: [id number]  Long term pt of mine who called for xanax rx and appt was made for pt to be seen prior to giving Rx.  I have given pt #30/0RF rx of 0.5mg  Xanax for several years.  She has never asked for a refill in a year and never asks for increased dosage.  Rx given.

## 2013-12-12 ENCOUNTER — Ambulatory Visit: Payer: BC Managed Care – PPO | Admitting: Obstetrics & Gynecology

## 2013-12-16 ENCOUNTER — Ambulatory Visit: Payer: BC Managed Care – PPO | Admitting: Obstetrics & Gynecology

## 2013-12-19 ENCOUNTER — Encounter: Payer: Self-pay | Admitting: Obstetrics & Gynecology

## 2014-06-23 ENCOUNTER — Telehealth: Payer: Self-pay | Admitting: Internal Medicine

## 2014-06-23 ENCOUNTER — Encounter: Payer: Self-pay | Admitting: Family Medicine

## 2014-06-23 ENCOUNTER — Ambulatory Visit (INDEPENDENT_AMBULATORY_CARE_PROVIDER_SITE_OTHER)
Admission: RE | Admit: 2014-06-23 | Discharge: 2014-06-23 | Disposition: A | Discharge status: Home / Self Care | Payer: No Typology Code available for payment source | Source: Ambulatory Visit | Attending: Family Medicine | Admitting: Family Medicine

## 2014-06-23 ENCOUNTER — Ambulatory Visit (INDEPENDENT_AMBULATORY_CARE_PROVIDER_SITE_OTHER): Payer: No Typology Code available for payment source | Admitting: Family Medicine

## 2014-06-23 VITALS — BP 104/68 | HR 55 | Temp 97.6°F | Wt 194.8 lb

## 2014-06-23 DIAGNOSIS — M25531 Pain in right wrist: Secondary | ICD-10-CM

## 2014-06-23 NOTE — Telephone Encounter (Signed)
Patient Name: Erin HackerSUSAN Apodaca  DOB: [birth year]    Initial Comment Caller states she has wrist pain, thinks carpal tunnel.   Nurse Assessment  Nurse: Sherilyn CooterHenry, RN, Thurmond ButtsWade Date/Time Lamount Cohen(Eastern Time): 06/23/2014 8:13:21 AM  Confirm and document reason for call. If symptomatic, describe symptoms. ---Caller states that she has right wrist pain which began Wednesday night. The pain is constant. She rates her pain as 7 on 0-10 scale. She denies numbness in her hand, fingers, or going up her arm.  Has the patient traveled out of the country within the last 30 days? ---No  Does the patient require triage? ---Yes  Related visit to physician within the last 2 weeks? ---No  Does the PT have any chronic conditions? (i.e. diabetes, asthma, etc.) ---No  Did the patient indicate they were pregnant? ---No     Guidelines    Guideline Title Affirmed Question Affirmed Notes  Hand and Wrist Pain Swollen joint of new onset Swelling of fingers, couldn't get her ring on today   Final Disposition User   See PCP When Office is Open (within 3 days) Sherilyn CooterHenry, RN, Thurmond ButtsWade    Comments  [facility] at 11:45 with Dr. Crawford GivensGraham Duncan

## 2014-06-23 NOTE — Progress Notes (Signed)
Pre visit review using our clinic review tool, if applicable. No additional management support is needed unless otherwise documented below in the visit note.  Pain for 2 days.  R wrist pain.  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  Pain along the flexor side of wrist, pain in the R 1st two fingers.  No trauma.   Types a lot at work.  R handed.  Grip is still okay.  Pain but not tingling.  Taking tylenol for pain.   Pain is some better today, has been resting the joint.   Meds, vitals, and allergies reviewed.   ROS: See HPI.  Otherwise, noncontributory.  nad R wrist with normal inspection, not bruised.  No erythema.   R snuff box ttp, slightly R finklestein positive.  phalen and tinel neg Distally NV intact w/o tendon failure in the fingers.

## 2014-06-23 NOTE — Patient Instructions (Signed)
Use the brace for now, ice/tylenol/ibuprofen (with food) as needed.  Gradually wean out of the brace.  We'll contact you with your xray report. Likely tendonitis.  Take care.  Glad to see you.

## 2014-06-23 NOTE — Telephone Encounter (Signed)
FYI

## 2014-06-25 NOTE — Assessment & Plan Note (Signed)
Likely tendonitis.  Splinted in spica, xray neg.  F/u prn.  Routed to PCP as FYI.  See AVS.

## 2014-11-30 ENCOUNTER — Ambulatory Visit (INDEPENDENT_AMBULATORY_CARE_PROVIDER_SITE_OTHER): Payer: No Typology Code available for payment source | Admitting: Obstetrics & Gynecology

## 2014-11-30 ENCOUNTER — Encounter: Payer: Self-pay | Admitting: Obstetrics & Gynecology

## 2014-11-30 VITALS — BP 116/62 | HR 60 | Resp 25 | Ht 65.5 in | Wt 199.0 lb

## 2014-11-30 DIAGNOSIS — Z0000 Encounter for general adult medical examination without abnormal findings: Secondary | ICD-10-CM | POA: Diagnosis not present

## 2014-11-30 DIAGNOSIS — Z124 Encounter for screening for malignant neoplasm of cervix: Secondary | ICD-10-CM

## 2014-11-30 DIAGNOSIS — Z01419 Encounter for gynecological examination (general) (routine) without abnormal findings: Secondary | ICD-10-CM

## 2014-11-30 LAB — COMPREHENSIVE METABOLIC PANEL
ALK PHOS: 69 U/L (ref 33–115)
ALT: 22 U/L (ref 6–29)
AST: 21 U/L (ref 10–35)
Albumin: 4.4 g/dL (ref 3.6–5.1)
BUN: 16 mg/dL (ref 7–25)
CALCIUM: 10 mg/dL (ref 8.6–10.2)
CO2: 28 mmol/L (ref 20–31)
Chloride: 104 mmol/L (ref 98–110)
Creat: 0.87 mg/dL (ref 0.50–1.10)
Glucose, Bld: 88 mg/dL (ref 65–99)
POTASSIUM: 4.5 mmol/L (ref 3.5–5.3)
Sodium: 138 mmol/L (ref 135–146)
TOTAL PROTEIN: 7.2 g/dL (ref 6.1–8.1)
Total Bilirubin: 0.6 mg/dL (ref 0.2–1.2)

## 2014-11-30 LAB — POCT URINALYSIS DIPSTICK
BILIRUBIN UA: NEGATIVE
Glucose, UA: NEGATIVE
Ketones, UA: NEGATIVE
LEUKOCYTES UA: NEGATIVE
NITRITE UA: NEGATIVE
Protein, UA: NEGATIVE
Urobilinogen, UA: NEGATIVE
pH, UA: 5

## 2014-11-30 LAB — VITAMIN D 25 HYDROXY (VIT D DEFICIENCY, FRACTURES): VIT D 25 HYDROXY: 33 ng/mL (ref 30–100)

## 2014-11-30 LAB — TSH: TSH: 1.998 u[IU]/mL (ref 0.350–4.500)

## 2014-11-30 LAB — LIPID PANEL
CHOL/HDL RATIO: 5 ratio (ref <=5.0)
CHOLESTEROL: 215 mg/dL — AB (ref 125–200)
HDL: 43 mg/dL — AB (ref >=46)
LDL Cholesterol: 148 mg/dL — ABNORMAL HIGH (ref <130)
Triglycerides: 122 mg/dL (ref <150)
VLDL: 24 mg/dL (ref <30)

## 2014-11-30 LAB — HEMOGLOBIN, FINGERSTICK: Hemoglobin, fingerstick: 14.1 g/dL (ref 12.0–16.0)

## 2014-11-30 MED ORDER — MEDROXYPROGESTERONE ACETATE 2.5 MG PO TABS: 2.5000 mg | ORAL_TABLET | Freq: Every day | ORAL | Status: DC

## 2014-11-30 MED ORDER — ALPRAZOLAM 0.5 MG PO TABS: 0.5000 mg | ORAL_TABLET | Freq: Every evening | ORAL | Status: DC | PRN

## 2014-11-30 MED ORDER — ESTRADIOL 0.5 MG PO TABS: 0.5000 mg | ORAL_TABLET | Freq: Every day | ORAL | Status: DC
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
## 2014-12-05 LAB — IPS PAP TEST WITH HPV

## 2014-12-07 ENCOUNTER — Telehealth: Payer: Self-pay

## 2014-12-07 NOTE — Telephone Encounter (Signed)
Lmtcb//kn 

## 2014-12-07 NOTE — Telephone Encounter (Signed)
Patient returning your call.

## 2014-12-07 NOTE — Telephone Encounter (Signed)
Patient notified of results.//kn 

## 2014-12-07 NOTE — Telephone Encounter (Signed)
-----   Message from Jerene BearsMary S Miller, MD sent at 12/06/2014  3:38 PM EDT ----- Inform pap and HR HPV were negative.  If in recall, ok to remove.  She is back to routine screening. 02 recall.  Inform BV noted on pap.  If symptomatic, ok to treat with metrogel 0.75% one applicator qhs x 5 nights or flagyl 500mg  bid x 7 days.

## 2015-02-13 ENCOUNTER — Telehealth: Payer: Self-pay | Admitting: Emergency Medicine

## 2015-02-13 DIAGNOSIS — Z1239 Encounter for other screening for malignant neoplasm of breast: Secondary | ICD-10-CM

## 2015-02-13 NOTE — Telephone Encounter (Signed)
Call to patient with message from Dr. Hyacinth MeekerMiller. Patient aware of need.  Offered to schedule Mammogram appointment for her, she agrees. Will call back with appointment.   Appointment obtained for 02/21/15 at 0800 [facility]. Patient agreeable.  Will call back with any concerns.  Routing to provider for final review. Patient agreeable to disposition. Will close encounter.

## 2015-02-13 NOTE — Telephone Encounter (Signed)
-----   Message from Jerene BearsMary S Miller, MD sent at 02/09/2015  2:02 PM EST ----- Regarding: MMG Please call pt.  She has not gone for her MMG appt.  She needs to do this.  She is on HRT and this is very important.  Last one was 2014.  Can you please let her know that I'm still waiting for her to complete her MMG?  Thanks.  Please open phone note.  Thanks.  MSM

## 2015-02-21 ENCOUNTER — Ambulatory Visit
Admission: RE | Admit: 2015-02-21 | Discharge: 2015-02-21 | Disposition: A | Discharge status: Home / Self Care | Payer: BLUE CROSS/BLUE SHIELD | Source: Ambulatory Visit | Attending: Obstetrics & Gynecology | Admitting: Obstetrics & Gynecology

## 2015-02-21 DIAGNOSIS — Z1231 Encounter for screening mammogram for malignant neoplasm of breast: Secondary | ICD-10-CM | POA: Diagnosis not present

## 2015-02-21 DIAGNOSIS — Z1239 Encounter for other screening for malignant neoplasm of breast: Secondary | ICD-10-CM

## 2015-09-24 ENCOUNTER — Ambulatory Visit (INDEPENDENT_AMBULATORY_CARE_PROVIDER_SITE_OTHER): Payer: BLUE CROSS/BLUE SHIELD | Admitting: Obstetrics & Gynecology

## 2015-09-24 ENCOUNTER — Encounter: Payer: Self-pay | Admitting: Obstetrics & Gynecology

## 2015-09-24 VITALS — BP 90/58 | HR 58 | Resp 16 | Ht 66.0 in | Wt 201.8 lb

## 2015-09-24 DIAGNOSIS — Z79890 Hormone replacement therapy (postmenopausal): Secondary | ICD-10-CM

## 2015-09-24 DIAGNOSIS — IMO0002 Reserved for concepts with insufficient information to code with codable children: Secondary | ICD-10-CM

## 2015-09-24 DIAGNOSIS — E668 Other obesity: Secondary | ICD-10-CM
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
## 2015-09-25 ENCOUNTER — Encounter: Payer: Self-pay | Admitting: Obstetrics & Gynecology

## 2015-09-25 DIAGNOSIS — IMO0002 Reserved for concepts with insufficient information to code with codable children: Secondary | ICD-10-CM | POA: Insufficient documentation

## 2015-10-10 ENCOUNTER — Other Ambulatory Visit: Payer: Self-pay

## 2015-10-10 NOTE — Telephone Encounter (Signed)
Medication refill request: Alprazolam 0.5mg  Last AEX:  11/30/14 SM Next AEX: 03/27/16 Last MMG (if hormonal medication request): 02/21/15 BIRADS1 negative Refill authorized: 11/30/14 #30 w/1 refills; today please advise

## 2015-10-12 MED ORDER — ALPRAZOLAM 0.5 MG PO TABS: 0.5000 mg | ORAL_TABLET | Freq: Every evening | ORAL | 0 refills | Status: DC | PRN

## 2015-11-28 ENCOUNTER — Telehealth: Payer: Self-pay | Admitting: Obstetrics & Gynecology

## 2015-11-28 MED ORDER — ESTRADIOL 0.5 MG PO TABS: 0.5000 mg | ORAL_TABLET | Freq: Every day | ORAL | 3 refills | Status: DC

## 2015-11-28 MED ORDER — MEDROXYPROGESTERONE ACETATE 2.5 MG PO TABS: 2.5000 mg | ORAL_TABLET | Freq: Every day | ORAL | 3 refills | Status: DC

## 2015-11-28 NOTE — Telephone Encounter (Addendum)
Patient would like to restart her HRT prescriptions. Patient said that she discussed this with Dr.Miller at her last appointment. Confirmed Pharmacy on file. Patient said no need to call her, just call in the prescriptions unless you have questions.

## 2015-11-28 NOTE — Telephone Encounter (Signed)
Spoke with patient. Patient requesting refill on HRT -Provera 2.5mg  daily and Estrace 0.5mg  daily. Patient was in for OV with Dr. Hyacinth MeekerMiller 09/24/15 for HRT. Patient stopped taking HRT after that visit. Patient reports she "just doesn't feel well" and is having "horrible night sweats". Patient states she thought she would like to try coming off since she had been on them for 9 years, but feels she needs to go back on the HRT. Patient states per last OV Dr.Miller advised she could restart them at anytime if she decided to. Advised patient Dr. Hyacinth MeekerMiller is out of the office, would review with covering provider for recommendations and return call.  Patient is agreeable.   Dr. Edward JollySilva, please advise on refills?   CC: Dr. Hyacinth MeekerMiller

## 2015-11-28 NOTE — Telephone Encounter (Signed)
Spoke with patient. Advised refills for Estradiol 0.5mg  daily #30/3RF and Provera 2.5mg  #30/3RF have been placed as recommended per Dr. Edward JollySilva. Will see Dr. Hyacinth MeekerMiller 03/27/16 for AEX. Patient verbalizes understanding and is agreeable.    Routing to provider for final review. Patient is agreeable to disposition. Will close encounter.  CC: Dr. Hyacinth MeekerMiller

## 2015-11-28 NOTE — Telephone Encounter (Signed)
Ok to return to Estradiol 0.5 mg daily and Provera 2.5 mg daily until annual exam is due in February 2018.   Cc- Dr. Hyacinth MeekerMiller

## 2016-02-08 DIAGNOSIS — M7541 Impingement syndrome of right shoulder: Secondary | ICD-10-CM | POA: Diagnosis not present

## 2016-02-08 DIAGNOSIS — M25511 Pain in right shoulder: Secondary | ICD-10-CM | POA: Diagnosis not present

## 2016-02-25 DIAGNOSIS — M7541 Impingement syndrome of right shoulder: Secondary | ICD-10-CM | POA: Diagnosis not present

## 2016-03-03 DIAGNOSIS — M7541 Impingement syndrome of right shoulder: Secondary | ICD-10-CM | POA: Diagnosis not present

## 2016-03-10 DIAGNOSIS — M7541 Impingement syndrome of right shoulder: Secondary | ICD-10-CM | POA: Diagnosis not present

## 2016-03-12 DIAGNOSIS — M7541 Impingement syndrome of right shoulder: Secondary | ICD-10-CM | POA: Diagnosis not present

## 2016-03-12 DIAGNOSIS — M25511 Pain in right shoulder: Secondary | ICD-10-CM | POA: Diagnosis not present

## 2016-03-24 DIAGNOSIS — M7541 Impingement syndrome of right shoulder: Secondary | ICD-10-CM | POA: Diagnosis not present
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
## 2016-03-27 ENCOUNTER — Encounter: Payer: Self-pay | Admitting: Obstetrics & Gynecology

## 2016-03-27 ENCOUNTER — Ambulatory Visit (INDEPENDENT_AMBULATORY_CARE_PROVIDER_SITE_OTHER): Payer: BLUE CROSS/BLUE SHIELD | Admitting: Obstetrics & Gynecology

## 2016-03-27 VITALS — BP 120/70 | HR 62 | Resp 12 | Ht 65.5 in | Wt 198.8 lb

## 2016-03-27 DIAGNOSIS — Z01419 Encounter for gynecological examination (general) (routine) without abnormal findings: Secondary | ICD-10-CM | POA: Diagnosis not present

## 2016-03-27 DIAGNOSIS — Z79890 Hormone replacement therapy (postmenopausal): Secondary | ICD-10-CM | POA: Diagnosis not present

## 2016-03-27 DIAGNOSIS — Z0000 Encounter for general adult medical examination without abnormal findings: Secondary | ICD-10-CM | POA: Diagnosis not present

## 2016-03-27 DIAGNOSIS — E049 Nontoxic goiter, unspecified: Secondary | ICD-10-CM | POA: Diagnosis not present

## 2016-03-27 LAB — LIPID PANEL
CHOL/HDL RATIO: 4.8 ratio (ref <5.0)
CHOLESTEROL: 226 mg/dL — AB (ref <200)
HDL: 47 mg/dL — AB (ref >50)
LDL Cholesterol: 157 mg/dL — ABNORMAL HIGH (ref <100)
Triglycerides: 112 mg/dL (ref <150)
VLDL: 22 mg/dL (ref <30)

## 2016-03-27 LAB — POCT URINALYSIS DIPSTICK
BILIRUBIN UA: NEGATIVE
Glucose, UA: NEGATIVE
KETONES UA: NEGATIVE
Leukocytes, UA: NEGATIVE
Nitrite, UA: NEGATIVE
PH UA: 5
Protein, UA: NEGATIVE
Urobilinogen, UA: NEGATIVE

## 2016-03-27 LAB — COMPREHENSIVE METABOLIC PANEL
ALT: 18 U/L (ref 6–29)
AST: 19 U/L (ref 10–35)
Albumin: 4.6 g/dL (ref 3.6–5.1)
Alkaline Phosphatase: 79 U/L (ref 33–115)
BUN: 17 mg/dL (ref 7–25)
CALCIUM: 10.1 mg/dL (ref 8.6–10.2)
CHLORIDE: 107 mmol/L (ref 98–110)
CO2: 28 mmol/L (ref 20–31)
Creat: 1.09 mg/dL (ref 0.50–1.10)
GLUCOSE: 81 mg/dL (ref 65–99)
POTASSIUM: 4.5 mmol/L (ref 3.5–5.3)
Sodium: 142 mmol/L (ref 135–146)
TOTAL PROTEIN: 7.5 g/dL (ref 6.1–8.1)
Total Bilirubin: 0.5 mg/dL (ref 0.2–1.2)

## 2016-03-27 LAB — CBC
HEMATOCRIT: 42.2 % (ref 35.0–45.0)
Hemoglobin: 14 g/dL (ref 11.7–15.5)
MCH: 29.6 pg (ref 27.0–33.0)
MCHC: 33.2 g/dL (ref 32.0–36.0)
MCV: 89.2 fL (ref 80.0–100.0)
MPV: 10.9 fL (ref 7.5–12.5)
PLATELETS: 302 10*3/uL (ref 140–400)
RBC: 4.73 MIL/uL (ref 3.80–5.10)
RDW: 13.5 % (ref 11.0–15.0)
WBC: 4.9 10*3/uL (ref 3.8–10.8)

## 2016-03-27 LAB — TSH: TSH: 2.19 mIU/L

## 2016-03-27 LAB — VITAMIN D 25 HYDROXY (VIT D DEFICIENCY, FRACTURES): VIT D 25 HYDROXY: 28 ng/mL — AB (ref 30–100)

## 2016-03-27 MED ORDER — MEDROXYPROGESTERONE ACETATE 2.5 MG PO TABS: 2.5000 mg | ORAL_TABLET | Freq: Every day | ORAL | 4 refills | Status: DC

## 2016-03-27 MED ORDER — ESTRADIOL 0.5 MG PO TABS: 0.5000 mg | ORAL_TABLET | Freq: Every day | ORAL | 4 refills | Status: DC

## 2016-03-27 MED ORDER — ALPRAZOLAM 0.5 MG PO TABS: 0.5000 mg | ORAL_TABLET | Freq: Every evening | ORAL | 0 refills | Status: DC | PRN

## 2016-03-31 ENCOUNTER — Telehealth: Payer: Self-pay

## 2016-03-31 ENCOUNTER — Other Ambulatory Visit: Payer: Self-pay | Admitting: Obstetrics & Gynecology

## 2016-03-31 DIAGNOSIS — Z803 Family history of malignant neoplasm of breast: Secondary | ICD-10-CM

## 2016-03-31 DIAGNOSIS — E049 Nontoxic goiter, unspecified: Secondary | ICD-10-CM

## 2016-03-31 DIAGNOSIS — Z1231 Encounter for screening mammogram for malignant neoplasm of breast: Secondary | ICD-10-CM

## 2016-03-31 NOTE — Telephone Encounter (Signed)
Left message to call Genieve Ramaswamy at [phone]. 

## 2016-03-31 NOTE — Telephone Encounter (Addendum)
-----   Message from Jerene BearsMary S Miller, MD sent at 03/30/2016  3:38 PM EST ----- Regarding: orders Pt has enlarged thyroid at AEX on Thursday.  She needs a thyroid ultrasound.  Also, her mother has hx of breast cancer in 1 breast and then years later in the other breast.  I did a risk calculation on her and she should qualify for every other year MRI.  Her MMG is due now.  Please schedule for her a 3D and then after this is done, I will order the MRI and see what happens with coverage.  When she goes for her MMG, she needs to make sure they understand her mother had breast cancer twice in each breast, not a single cancer with recurrence.  Thanks.

## 2016-03-31 NOTE — Telephone Encounter (Signed)
Spoke with patient. Patient states she is available any time next week or after in the morning for thyroid ultrasound and mammogram. Advised I will call to schedule both appointments and return call with appointment dates and times. Patient is agreeable.   Call to [facility]. Screening mammogram [facility] for [facility]/14/2016 at [facility]. Thyroid ultrasound scheduled for 04/14/2016 at 11:10 am [facility] at [address] Patient has been notified of appointment dates and times and is agreeable. Placed in imaging hold.  Routing to provider for final review. Patient agreeable to disposition. Will close encounter.

## 2016-04-07 ENCOUNTER — Other Ambulatory Visit: Payer: BLUE CROSS/BLUE SHIELD

## 2016-04-14 ENCOUNTER — Ambulatory Visit
Admission: RE | Admit: 2016-04-14 | Discharge: 2016-04-14 | Disposition: A | Discharge status: Home / Self Care | Payer: BLUE CROSS/BLUE SHIELD | Source: Ambulatory Visit | Attending: Obstetrics & Gynecology | Admitting: Obstetrics & Gynecology

## 2016-04-14 ENCOUNTER — Ambulatory Visit: Payer: BLUE CROSS/BLUE SHIELD

## 2016-04-14 DIAGNOSIS — Z803 Family history of malignant neoplasm of breast: Secondary | ICD-10-CM

## 2016-04-14 DIAGNOSIS — Z1231 Encounter for screening mammogram for malignant neoplasm of breast: Secondary | ICD-10-CM | POA: Diagnosis not present

## 2016-04-14 DIAGNOSIS — E049 Nontoxic goiter, unspecified: Secondary | ICD-10-CM

## 2016-04-14 DIAGNOSIS — E042 Nontoxic multinodular goiter: Secondary | ICD-10-CM | POA: Diagnosis not present

## 2016-07-03 DIAGNOSIS — M7541 Impingement syndrome of right shoulder: Secondary | ICD-10-CM | POA: Diagnosis not present

## 2016-07-12 DIAGNOSIS — M7541 Impingement syndrome of right shoulder: Secondary | ICD-10-CM | POA: Diagnosis not present

## 2016-07-21 DIAGNOSIS — M7541 Impingement syndrome of right shoulder: Secondary | ICD-10-CM | POA: Diagnosis not present

## 2016-07-21 DIAGNOSIS — M19011 Primary osteoarthritis, right shoulder: Secondary | ICD-10-CM | POA: Diagnosis not present

## 2016-07-21 DIAGNOSIS — S46011D Strain of muscle(s) and tendon(s) of the rotator cuff of right shoulder, subsequent encounter: Secondary | ICD-10-CM | POA: Diagnosis not present

## 2016-08-15 DIAGNOSIS — G8918 Other acute postprocedural pain: Secondary | ICD-10-CM | POA: Diagnosis not present

## 2016-08-15 DIAGNOSIS — M19011 Primary osteoarthritis, right shoulder: Secondary | ICD-10-CM | POA: Diagnosis not present

## 2016-08-15 DIAGNOSIS — M24111 Other articular cartilage disorders, right shoulder: Secondary | ICD-10-CM | POA: Diagnosis not present

## 2016-08-15 DIAGNOSIS — S46011D Strain of muscle(s) and tendon(s) of the rotator cuff of right shoulder, subsequent encounter: Secondary | ICD-10-CM | POA: Diagnosis not present

## 2016-08-15 DIAGNOSIS — M75111 Incomplete rotator cuff tear or rupture of right shoulder, not specified as traumatic: Secondary | ICD-10-CM | POA: Diagnosis not present

## 2016-08-15 DIAGNOSIS — M66811 Spontaneous rupture of other tendons, right shoulder: Secondary | ICD-10-CM | POA: Diagnosis not present

## 2016-08-15 DIAGNOSIS — M7541 Impingement syndrome of right shoulder: Secondary | ICD-10-CM | POA: Diagnosis not present

## 2016-08-15 DIAGNOSIS — S43431A Superior glenoid labrum lesion of right shoulder, initial encounter: Secondary | ICD-10-CM | POA: Diagnosis not present

## 2016-08-15 DIAGNOSIS — S46091A Other injury of muscle(s) and tendon(s) of the rotator cuff of right shoulder, initial encounter: Secondary | ICD-10-CM | POA: Diagnosis not present

## 2016-08-15 DIAGNOSIS — I890 Lymphedema, not elsewhere classified: Secondary | ICD-10-CM | POA: Diagnosis not present

## 2016-08-29 DIAGNOSIS — M25511 Pain in right shoulder: Secondary | ICD-10-CM | POA: Diagnosis not present

## 2016-09-02 DIAGNOSIS — M25511 Pain in right shoulder: Secondary | ICD-10-CM | POA: Diagnosis not present

## 2016-09-05 DIAGNOSIS — M25511 Pain in right shoulder: Secondary | ICD-10-CM | POA: Diagnosis not present

## 2016-09-10 DIAGNOSIS — M25511 Pain in right shoulder: Secondary | ICD-10-CM | POA: Diagnosis not present

## 2016-09-17 DIAGNOSIS — M25511 Pain in right shoulder: Secondary | ICD-10-CM | POA: Diagnosis not present

## 2017-01-12 ENCOUNTER — Telehealth: Payer: Self-pay

## 2017-01-12 NOTE — Telephone Encounter (Signed)
Copied from CRM [id number]. Topic: Appointment Scheduling - Scheduling Inquiry for Clinic >> Jan 12, 2017  8:36 AM Eston Mouldavis, Cheri B wrote: Reason for CRM: PT has moved and wants to transfer PCP from [facility] to [facility] Dayton [facility]Aron at [facility].    Okay for patient to transfer?

## 2017-01-13 NOTE — Telephone Encounter (Signed)
TA-Plz see pt message below/she would like to transfer care to you from Dr. Neta Mendsullo/plz advise/thx dmf

## 2017-01-15 NOTE — Telephone Encounter (Signed)
Please help patient get on Dr. Elmer SowAron's schedule for a transfer appointment.  Thank you! :)

## 2017-01-15 NOTE — Telephone Encounter (Signed)
Yes transfer is okay with me. 

## 2017-03-09 ENCOUNTER — Ambulatory Visit: Payer: BLUE CROSS/BLUE SHIELD | Admitting: Family Medicine

## 2017-04-02 ENCOUNTER — Other Ambulatory Visit: Payer: Self-pay

## 2017-04-02 NOTE — Telephone Encounter (Signed)
Received faxed refill requestion for the following:  Medication refill request: Medroxyprogesterone 2.5mg  #90 Last AEX:  03-27-16 Next AEX: 07-03-17 Last MMG (if hormonal medication request): 04-14-16 [id number] Refill authorized: Please advise

## 2017-04-03 MED ORDER — MEDROXYPROGESTERONE ACETATE 2.5 MG PO TABS: 2.5000 mg | ORAL_TABLET | Freq: Every day | ORAL | 1 refills | Status: DC

## 2017-04-07 ENCOUNTER — Other Ambulatory Visit: Payer: Self-pay | Admitting: *Deleted

## 2017-04-07 NOTE — Telephone Encounter (Signed)
Medication refill request: Provera Last AEX:  03/27/16 SM  Next AEX: 07/03/17  Last MMG (if hormonal medication request): 04/15/16 BIRADS 1 negative  Refill authorized: please advise  Spoke with patient. Patient states she needed prescription to [facility] [place]. RN advised would [facility] to [facility]. [facility] and spoke with Windell MouldingRuth who states she cancelled prescription for provera sent on 04/03/17.

## 2017-04-08 MED ORDER — MEDROXYPROGESTERONE ACETATE 2.5 MG PO TABS: 2.5000 mg | ORAL_TABLET | Freq: Every day | ORAL | 1 refills | Status: DC

## 2017-05-06 ENCOUNTER — Ambulatory Visit: Payer: BLUE CROSS/BLUE SHIELD | Admitting: Family Medicine

## 2017-05-13 ENCOUNTER — Ambulatory Visit: Payer: BLUE CROSS/BLUE SHIELD | Admitting: Family Medicine

## 2017-05-20 ENCOUNTER — Ambulatory Visit (INDEPENDENT_AMBULATORY_CARE_PROVIDER_SITE_OTHER): Payer: BLUE CROSS/BLUE SHIELD | Admitting: Family Medicine

## 2017-05-20 ENCOUNTER — Encounter: Payer: Self-pay | Admitting: Family Medicine

## 2017-05-20 DIAGNOSIS — Z7689 Persons encountering health services in other specified circumstances: Secondary | ICD-10-CM | POA: Diagnosis not present
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
## 2017-05-20 NOTE — Assessment & Plan Note (Signed)
>  25 minutes spent in face to face time with patient, >50% spent in counselling or coordination of care. Discussed colonoscopy and asking her insurance provider about cologuard. She is also interested in receiving shingrix- while FDA approved to be given at her age, may also not be covered by insurance provider.  She will ask insurance company about coverage for this as well.

## 2017-05-20 NOTE — Patient Instructions (Signed)
Great to meet you.  Please call your insurance company to ask about the shingles vaccine.  Ask them if they cover cologuard yet.

## 2017-06-10 ENCOUNTER — Other Ambulatory Visit: Payer: Self-pay | Admitting: Obstetrics & Gynecology

## 2017-06-10 MED ORDER — MEDROXYPROGESTERONE ACETATE 2.5 MG PO TABS: 2.5000 mg | ORAL_TABLET | Freq: Every day | ORAL | 0 refills | Status: DC

## 2017-06-10 NOTE — Telephone Encounter (Signed)
Medication refill request: Estrace  Last AEX:  03-27-16  Next AEX: 07-03-17  Last MMG (if hormonal medication request): 04-15-16 WNL Spoke with patient and advised to schedule MMG Refill authorized: please advise

## 2017-06-10 NOTE — Telephone Encounter (Signed)
I will send in one month of the estrace and provera. Please make sure she is taking the provera daily.

## 2017-06-10 NOTE — Telephone Encounter (Signed)
Left detailed message with patient regarding the estrace and Provera. Advised patient to be sure she is taking both. -eh

## 2017-06-22 ENCOUNTER — Telehealth: Payer: Self-pay | Admitting: Family Medicine

## 2017-06-22 NOTE — Telephone Encounter (Signed)
Called pt to follow up about her bill concern for DOS 4/3 with Dr. Dayton Martes. Dr. Dayton Martes decided not to charge pt for visit due to patient did not get a physical and did not have any concerns. Pt just wanted to list Dr. Dayton Martes as her PCP and have a doctor if she had any concerns. She also stated she just was doing what the phone agent told her to do and that was she had to make an appointment in order to establish care.Patient is to call our office when she need to get her physical done or need acute visit.

## 2017-07-03 ENCOUNTER — Encounter: Payer: Self-pay | Admitting: Obstetrics & Gynecology

## 2017-07-03 ENCOUNTER — Ambulatory Visit (INDEPENDENT_AMBULATORY_CARE_PROVIDER_SITE_OTHER): Payer: BLUE CROSS/BLUE SHIELD | Admitting: Obstetrics & Gynecology

## 2017-07-03 ENCOUNTER — Other Ambulatory Visit: Payer: Self-pay

## 2017-07-03 ENCOUNTER — Other Ambulatory Visit (HOSPITAL_COMMUNITY)
Admission: RE | Admit: 2017-07-03 | Discharge: 2017-07-03 | Disposition: A | Discharge status: Home / Self Care | Payer: BLUE CROSS/BLUE SHIELD | Source: Ambulatory Visit | Attending: Obstetrics & Gynecology | Admitting: Obstetrics & Gynecology

## 2017-07-03 ENCOUNTER — Encounter

## 2017-07-03 ENCOUNTER — Other Ambulatory Visit: Payer: Self-pay | Admitting: Obstetrics & Gynecology

## 2017-07-03 VITALS — BP 118/64 | HR 76 | Resp 14 | Ht 65.5 in | Wt 201.6 lb

## 2017-07-03 DIAGNOSIS — Z01419 Encounter for gynecological examination (general) (routine) without abnormal findings: Secondary | ICD-10-CM | POA: Diagnosis not present

## 2017-07-03 DIAGNOSIS — Z0000 Encounter for general adult medical examination without abnormal findings: Secondary | ICD-10-CM | POA: Diagnosis not present

## 2017-07-03 DIAGNOSIS — Z1231 Encounter for screening mammogram for malignant neoplasm of breast: Secondary | ICD-10-CM

## 2017-07-03 DIAGNOSIS — E010 Iodine-deficiency related diffuse (endemic) goiter: Secondary | ICD-10-CM

## 2017-07-03 DIAGNOSIS — Z124 Encounter for screening for malignant neoplasm of cervix: Secondary | ICD-10-CM

## 2017-07-03 DIAGNOSIS — E042 Nontoxic multinodular goiter: Secondary | ICD-10-CM

## 2017-07-03 MED ORDER — MEDROXYPROGESTERONE ACETATE 2.5 MG PO TABS: 2.5000 mg | ORAL_TABLET | Freq: Every day | ORAL | 4 refills | Status: DC

## 2017-07-03 MED ORDER — ESTRADIOL 0.5 MG PO TABS: 0.5000 mg | ORAL_TABLET | Freq: Every day | ORAL | 4 refills | Status: DC
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
## 2017-07-03 NOTE — Progress Notes (Signed)
Patient scheduled while in office for thyroid US on 07/24/17 arriving at 1:10pm for 1:30pm appt. [address] location. MMG scheduled for 07/24/17 arriving at 3:10pm for 3:30pm. Patient declined earlier appointments offered, verbalizes understanding and is agreeable.

## 2017-07-07 LAB — CYTOLOGY - PAP
Diagnosis: NEGATIVE
HPV: NOT DETECTED

## 2017-07-08 ENCOUNTER — Other Ambulatory Visit: Payer: BLUE CROSS/BLUE SHIELD

## 2017-07-15 DIAGNOSIS — Z0000 Encounter for general adult medical examination without abnormal findings: Secondary | ICD-10-CM | POA: Diagnosis not present

## 2017-07-16 LAB — CBC
HEMOGLOBIN: 14.3 g/dL (ref 11.1–15.9)
Hematocrit: 43.6 % (ref 34.0–46.6)
MCH: 30.2 pg (ref 26.6–33.0)
MCHC: 32.8 g/dL (ref 31.5–35.7)
MCV: 92 fL (ref 79–97)
PLATELETS: 310 10*3/uL (ref 150–450)
RBC: 4.74 x10E6/uL (ref 3.77–5.28)
RDW: 13.4 % (ref 12.3–15.4)
WBC: 4.3 10*3/uL (ref 3.4–10.8)

## 2017-07-16 LAB — COMPREHENSIVE METABOLIC PANEL
A/G RATIO: 1.8 (ref 1.2–2.2)
ALT: 19 IU/L (ref 0–32)
AST: 25 IU/L (ref 0–40)
Albumin: 4.6 g/dL (ref 3.5–5.5)
Alkaline Phosphatase: 84 IU/L (ref 39–117)
BUN/Creatinine Ratio: 14 (ref 9–23)
BUN: 13 mg/dL (ref 6–24)
Bilirubin Total: 0.4 mg/dL (ref 0.0–1.2)
CALCIUM: 10.4 mg/dL — AB (ref 8.7–10.2)
CO2: 24 mmol/L (ref 20–29)
Chloride: 101 mmol/L (ref 96–106)
Creatinine, Ser: 0.91 mg/dL (ref 0.57–1.00)
GFR, EST AFRICAN AMERICAN: 85 mL/min/{1.73_m2} (ref >59)
GFR, EST NON AFRICAN AMERICAN: 74 mL/min/{1.73_m2} (ref >59)
GLOBULIN, TOTAL: 2.6 g/dL (ref 1.5–4.5)
Glucose: 89 mg/dL (ref 65–99)
POTASSIUM: 4.6 mmol/L (ref 3.5–5.2)
SODIUM: 141 mmol/L (ref 134–144)
TOTAL PROTEIN: 7.2 g/dL (ref 6.0–8.5)

## 2017-07-16 LAB — LIPID PANEL
CHOLESTEROL TOTAL: 235 mg/dL — AB (ref 100–199)
Chol/HDL Ratio: 5.1 ratio — ABNORMAL HIGH (ref 0.0–4.4)
HDL: 46 mg/dL (ref >39)
LDL CALC: 151 mg/dL — AB (ref 0–99)
TRIGLYCERIDES: 188 mg/dL — AB (ref 0–149)
VLDL Cholesterol Cal: 38 mg/dL (ref 5–40)

## 2017-07-16 LAB — TSH: TSH: 2.52 u[IU]/mL (ref 0.450–4.500)

## 2017-07-16 LAB — VITAMIN D 25 HYDROXY (VIT D DEFICIENCY, FRACTURES): Vit D, 25-Hydroxy: 26.4 ng/mL — ABNORMAL LOW (ref 30.0–100.0)

## 2017-07-24 ENCOUNTER — Ambulatory Visit
Admission: RE | Admit: 2017-07-24 | Discharge: 2017-07-24 | Disposition: A | Discharge status: Home / Self Care | Payer: BLUE CROSS/BLUE SHIELD | Source: Ambulatory Visit | Attending: Obstetrics & Gynecology | Admitting: Obstetrics & Gynecology

## 2017-07-24 ENCOUNTER — Other Ambulatory Visit: Payer: BLUE CROSS/BLUE SHIELD

## 2017-07-24 DIAGNOSIS — Z1231 Encounter for screening mammogram for malignant neoplasm of breast: Secondary | ICD-10-CM | POA: Diagnosis not present

## 2017-07-24 DIAGNOSIS — E010 Iodine-deficiency related diffuse (endemic) goiter: Secondary | ICD-10-CM

## 2017-07-24 DIAGNOSIS — E042 Nontoxic multinodular goiter: Secondary | ICD-10-CM

## 2017-07-24 DIAGNOSIS — E041 Nontoxic single thyroid nodule: Secondary | ICD-10-CM | POA: Diagnosis not present

## 2017-07-28 DIAGNOSIS — Z1211 Encounter for screening for malignant neoplasm of colon: Secondary | ICD-10-CM | POA: Diagnosis not present

## 2017-07-28 DIAGNOSIS — Z1212 Encounter for screening for malignant neoplasm of rectum: Secondary | ICD-10-CM | POA: Diagnosis not present

## 2017-07-28 LAB — COLOGUARD: Cologuard: NEGATIVE

## 2017-08-03 ENCOUNTER — Telehealth: Payer: Self-pay | Admitting: Obstetrics & Gynecology

## 2017-08-03 NOTE — Telephone Encounter (Signed)
Patient calling for test results. °

## 2017-08-03 NOTE — Telephone Encounter (Signed)
Dr. Hyacinth MeekerMiller -please review lab results dated 07/15/17 and advise.

## 2017-08-14 NOTE — Telephone Encounter (Signed)
Left detailed message for pt regarding results--labs, thyroid, and cologuard.  Some of these were previously routed with messages.    She does need a repeat calcium level.  Pt was advised of this on message.  Please call to schedule.  Can do at [facility] outpatient lab if more convenient for her.

## 2017-08-17 NOTE — Telephone Encounter (Signed)
Spoke with patient. [facility] will return [facility] [place] for repeat calcium level in 4wks. Patient will return call to office to notify once completed. Patient verbalizes understanding and is agreeable.   Future lab order placed for calcium and released.   Routing to provider for final review. Patient is agreeable to disposition. Will close encounter.

## 2017-09-11 ENCOUNTER — Other Ambulatory Visit: Payer: Self-pay | Admitting: *Deleted

## 2017-09-11 DIAGNOSIS — Z1211 Encounter for screening for malignant neoplasm of colon: Secondary | ICD-10-CM

## 2017-10-26 ENCOUNTER — Encounter: Payer: Self-pay | Admitting: Family Medicine

## 2017-10-26 ENCOUNTER — Ambulatory Visit (INDEPENDENT_AMBULATORY_CARE_PROVIDER_SITE_OTHER): Payer: BLUE CROSS/BLUE SHIELD | Admitting: Family Medicine

## 2017-10-26 VITALS — BP 116/68 | HR 62 | Temp 98.2°F | Ht 65.5 in | Wt 202.0 lb

## 2017-10-26 DIAGNOSIS — J019 Acute sinusitis, unspecified: Secondary | ICD-10-CM | POA: Diagnosis not present

## 2017-10-26 MED ORDER — AMOXICILLIN-POT CLAVULANATE 875-125 MG PO TABS: 1.0000 | ORAL_TABLET | Freq: Two times a day (BID) | ORAL | 0 refills | Status: AC

## 2017-10-26 NOTE — Patient Instructions (Signed)
Take antibiotic as directed- Augmentin twice daily x 10 days.  Drink lots of fluids.    Treat sympotmatically with Mucinex, nasal saline irrigation, and Tylenol/Ibuprofen.   Also try an antihistamine/decongestant like claritin D or zyrtec D over the counter- two times a day as needed ( have to sign for them at pharmacy).   Try over the counter flonase.  You can use warm compresses.     Call if not improving as expected in 5-7 days.
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
## 2017-11-17 ENCOUNTER — Telehealth: Payer: Self-pay | Admitting: *Deleted

## 2017-11-17 NOTE — Telephone Encounter (Signed)
-----   Message from Jerene Bears, MD sent at 11/17/2017  5:04 AM EDT ----- Regarding: repeat blood work Erin Cole, This pt had an elevated calcium level with blood work earlier this year.  She was supposed to go for a repeat calcium level.  I've never gotten this result.  Could you please call her and see if she went for the blood work.  If not, I can replace the order for her to do this at a [facility] closer to her home.  Thanks.  Erin Cole

## 2017-11-17 NOTE — Telephone Encounter (Signed)
Patient states she des not want to follow up on that right now. She states she is not having any problems and will talk about it at next AEX.   Dr. Gloris Ham Encounter closed.

## 2017-12-28 ENCOUNTER — Encounter: Payer: Self-pay | Admitting: Family Medicine

## 2017-12-28 ENCOUNTER — Ambulatory Visit (INDEPENDENT_AMBULATORY_CARE_PROVIDER_SITE_OTHER): Payer: BLUE CROSS/BLUE SHIELD | Admitting: Family Medicine

## 2017-12-28 VITALS — BP 116/60 | HR 69 | Temp 98.6°F | Ht 66.0 in | Wt 203.2 lb

## 2017-12-28 DIAGNOSIS — G5701 Lesion of sciatic nerve, right lower limb: Secondary | ICD-10-CM

## 2017-12-28 DIAGNOSIS — Z6831 Body mass index (BMI) 31.0-31.9, adult: Secondary | ICD-10-CM | POA: Insufficient documentation

## 2017-12-28 DIAGNOSIS — Z8349 Family history of other endocrine, nutritional and metabolic diseases: Secondary | ICD-10-CM

## 2017-12-28 DIAGNOSIS — Z6832 Body mass index (BMI) 32.0-32.9, adult: Secondary | ICD-10-CM

## 2017-12-28 DIAGNOSIS — M25551 Pain in right hip: Secondary | ICD-10-CM | POA: Diagnosis not present

## 2017-12-28 DIAGNOSIS — G5700 Lesion of sciatic nerve, unspecified lower limb: Secondary | ICD-10-CM | POA: Insufficient documentation

## 2017-12-28 LAB — COMPREHENSIVE METABOLIC PANEL
ALBUMIN: 4.6 g/dL (ref 3.5–5.2)
ALT: 18 U/L (ref 0–35)
AST: 21 U/L (ref 0–37)
Alkaline Phosphatase: 74 U/L (ref 39–117)
BUN: 15 mg/dL (ref 6–23)
CHLORIDE: 103 meq/L (ref 96–112)
CO2: 29 meq/L (ref 19–32)
CREATININE: 0.9 mg/dL (ref 0.40–1.20)
Calcium: 10.3 mg/dL (ref 8.4–10.5)
GFR: 70.04 mL/min (ref >60.00)
GLUCOSE: 102 mg/dL — AB (ref 70–99)
POTASSIUM: 3.8 meq/L (ref 3.5–5.1)
SODIUM: 139 meq/L (ref 135–145)
Total Bilirubin: 0.5 mg/dL (ref 0.2–1.2)
Total Protein: 7.8 g/dL (ref 6.0–8.3)

## 2017-12-28 LAB — T4, FREE: FREE T4: 0.67 ng/dL (ref 0.60–1.60)

## 2017-12-28 LAB — TSH: TSH: 2.77 u[IU]/mL (ref 0.35–4.50)

## 2017-12-28 NOTE — Assessment & Plan Note (Signed)
New->25 minutes spent in face to face time with patient, >50% spent in counselling or coordination of care Discussed exercises and handout given from sports medicine advisor. Advised adding Ibuprofen up to 800 mg three times daily with food, continue tylenol, for next two weeks. She will update me.

## 2017-12-28 NOTE — Patient Instructions (Addendum)
Great to see you. I will call you with your lab results from today and you can view them online.   Try Ibuprofen up to 800 mg three times daily with food for next 1- 2 weeks. Continue Tylenol.  Exercises in handout.  [employer] are the brand of shoes I was wearing today.  Update me in two weeks.
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
## 2017-12-28 NOTE — Assessment & Plan Note (Signed)
Check thyroid panel today 

## 2017-12-29 ENCOUNTER — Encounter: Payer: Self-pay | Admitting: Family Medicine

## 2017-12-29 LAB — T3: T3, Total: 129 ng/dL (ref 76–181)

## 2018-03-17 DIAGNOSIS — J1189 Influenza due to unidentified influenza virus with other manifestations: Secondary | ICD-10-CM | POA: Diagnosis not present

## 2018-03-17 DIAGNOSIS — R509 Fever, unspecified: Secondary | ICD-10-CM | POA: Diagnosis not present

## 2018-03-17 DIAGNOSIS — J029 Acute pharyngitis, unspecified: Secondary | ICD-10-CM | POA: Diagnosis not present

## 2018-03-22 ENCOUNTER — Encounter: Payer: Self-pay | Admitting: Family Medicine

## 2018-03-22 ENCOUNTER — Ambulatory Visit (INDEPENDENT_AMBULATORY_CARE_PROVIDER_SITE_OTHER): Payer: BLUE CROSS/BLUE SHIELD | Admitting: Family Medicine

## 2018-03-22 VITALS — BP 122/80 | HR 99 | Temp 99.0°F | Ht 66.0 in | Wt 201.5 lb

## 2018-03-22 DIAGNOSIS — J40 Bronchitis, not specified as acute or chronic: Secondary | ICD-10-CM | POA: Diagnosis not present

## 2018-03-22 MED ORDER — BENZONATATE 200 MG PO CAPS: 200.0000 mg | ORAL_CAPSULE | Freq: Two times a day (BID) | ORAL | 0 refills | Status: DC | PRN

## 2018-03-22 MED ORDER — AZITHROMYCIN 250 MG PO TABS: ORAL_TABLET | ORAL | 0 refills | Status: DC

## 2018-03-22 NOTE — Patient Instructions (Signed)
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
## 2018-07-23 ENCOUNTER — Other Ambulatory Visit: Payer: Self-pay

## 2018-07-23 ENCOUNTER — Ambulatory Visit: Payer: BLUE CROSS/BLUE SHIELD | Admitting: Obstetrics & Gynecology

## 2018-07-23 ENCOUNTER — Encounter: Payer: Self-pay | Admitting: Obstetrics & Gynecology

## 2018-07-23 ENCOUNTER — Ambulatory Visit (INDEPENDENT_AMBULATORY_CARE_PROVIDER_SITE_OTHER): Payer: BC Managed Care – PPO | Admitting: Obstetrics & Gynecology

## 2018-07-23 VITALS — BP 118/76 | HR 72 | Temp 98.0°F | Ht 65.5 in | Wt 202.0 lb

## 2018-07-23 DIAGNOSIS — Z0000 Encounter for general adult medical examination without abnormal findings: Secondary | ICD-10-CM

## 2018-07-23 DIAGNOSIS — Z01419 Encounter for gynecological examination (general) (routine) without abnormal findings: Secondary | ICD-10-CM

## 2018-07-23 MED ORDER — NALTREXONE-BUPROPION HCL ER 8-90 MG PO TB12: ORAL_TABLET | ORAL | 0 refills | Status: DC
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
## 2018-07-23 NOTE — Patient Instructions (Signed)
[facility] [address] [place], [place] [address] [phone]  Dr. Caren Beasley 

## 2018-07-24 LAB — LIPID PANEL

## 2018-07-26 LAB — LIPID PANEL
Chol/HDL Ratio: 5.6 ratio — ABNORMAL HIGH (ref 0.0–4.4)
Cholesterol, Total: 262 mg/dL — ABNORMAL HIGH (ref 100–199)
HDL: 47 mg/dL (ref >39)
LDL Calculated: 171 mg/dL — ABNORMAL HIGH (ref 0–99)
Triglycerides: 222 mg/dL — ABNORMAL HIGH (ref 0–149)
VLDL Cholesterol Cal: 44 mg/dL — ABNORMAL HIGH (ref 5–40)

## 2018-07-26 LAB — COMPREHENSIVE METABOLIC PANEL
ALT: 16 IU/L (ref 0–32)
AST: 21 IU/L (ref 0–40)
Albumin/Globulin Ratio: 1.9 (ref 1.2–2.2)
Albumin: 5 g/dL — ABNORMAL HIGH (ref 3.8–4.9)
Alkaline Phosphatase: 91 IU/L (ref 39–117)
BUN/Creatinine Ratio: 13 (ref 9–23)
BUN: 11 mg/dL (ref 6–24)
Bilirubin Total: <0.2 mg/dL (ref 0.0–1.2)
CO2: 23 mmol/L (ref 20–29)
Calcium: 10.6 mg/dL — ABNORMAL HIGH (ref 8.7–10.2)
Chloride: 103 mmol/L (ref 96–106)
Creatinine, Ser: 0.88 mg/dL (ref 0.57–1.00)
GFR calc Af Amer: 88 mL/min/{1.73_m2} (ref >59)
GFR calc non Af Amer: 76 mL/min/{1.73_m2} (ref >59)
Globulin, Total: 2.7 g/dL (ref 1.5–4.5)
Glucose: 88 mg/dL (ref 65–99)
Potassium: 4.6 mmol/L (ref 3.5–5.2)
Sodium: 142 mmol/L (ref 134–144)
Total Protein: 7.7 g/dL (ref 6.0–8.5)

## 2018-07-26 LAB — CBC
Hematocrit: 42.9 % (ref 34.0–46.6)
Hemoglobin: 14.5 g/dL (ref 11.1–15.9)
MCH: 30.6 pg (ref 26.6–33.0)
MCHC: 33.8 g/dL (ref 31.5–35.7)
MCV: 91 fL (ref 79–97)
Platelets: 331 10*3/uL (ref 150–450)
RBC: 4.74 x10E6/uL (ref 3.77–5.28)
RDW: 12.6 % (ref 11.7–15.4)
WBC: 5.9 10*3/uL (ref 3.4–10.8)

## 2018-07-26 LAB — VITAMIN D 25 HYDROXY (VIT D DEFICIENCY, FRACTURES): Vit D, 25-Hydroxy: 22.6 ng/mL — ABNORMAL LOW (ref 30.0–100.0)

## 2018-07-26 LAB — TSH: TSH: 2.66 u[IU]/mL (ref 0.450–4.500)

## 2018-07-27 ENCOUNTER — Telehealth: Payer: Self-pay | Admitting: *Deleted

## 2018-07-27 NOTE — Telephone Encounter (Signed)
-----   Message from Megan Salon, MD sent at 07/27/2018  7:22 AM EDT ----- Please let pt know her Calcium is mildly elevated again.  As this was present a year ago and now again, I think this needs additional evaluation.  I would typically refer a person to their PCP or an endocrinologist.  Does she have a preference?  Also, her cholesterol is really elevated.  Total was 262 and LDLs were 171.  I think this needs discussion with PCP for possible management.  Does she want me to send the labs to her PCP?  CBC and CMP were fine.  Vit D was 22.  She does need to take OTC Vit D daily--1000 IU daily.  Thanks.

## 2018-07-27 NOTE — Telephone Encounter (Signed)
LM for pt to call back.

## 2018-07-28 NOTE — Telephone Encounter (Signed)
Spoke with patient, advised as seen below per Dr. Sabra Heck. Patient request to f/u with her PCP, Dr. Deborra Medina. Copy of 07/23/18 labs faxed to PCP via Epic. Patient will call directly to schedule. Patient verbalizes understanding and is agreeable.    Patient requesting status of PA for Contrave. Advised per review of [email or web address] PA submitted on 07/23/18. Approved on June 8 Effective from 07/23/2018 through 01/18/2019.  KeyBarbra Sarks - Rx #: [id number]  Advised patient to follow up with her pharmacy for out of pocket cost and filling RX.    Routing to provider for final review. Patient is agreeable to disposition. Will close encounter.

## 2018-07-28 NOTE — Telephone Encounter (Signed)
Patient returning call.

## 2018-07-31 IMAGING — US US THYROID
1 series · 13 of 25 positions shown · non-contrast
Comparison: Prior thyroid ultrasound 04/14/2016

CLINICAL DATA: Prior ultrasound follow-up. 51-year-old female with
left-sided thyroid nodules

EXAM:
THYROID ULTRASOUND
TECHNIQUE: Ultrasound examination of the thyroid gland and adjacent soft
tissues was performed.

[Series 1: us thyroid · 0.06mm/px · 13 of 46 slices shown]
[im 1/46]
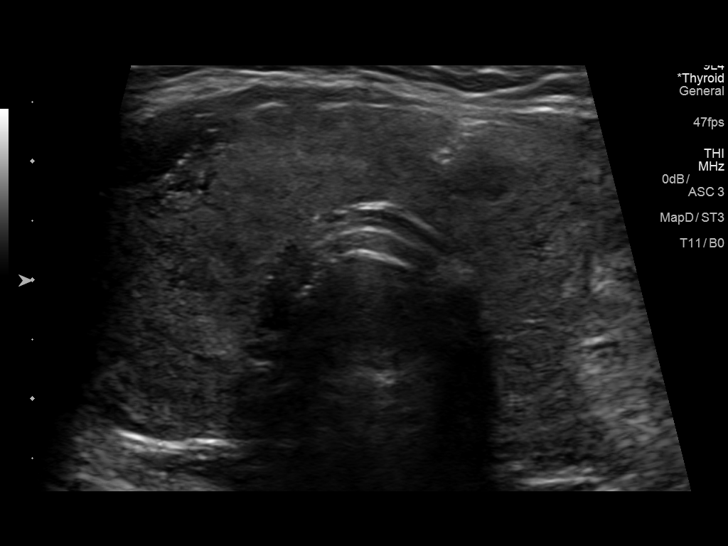
[im 4/46]
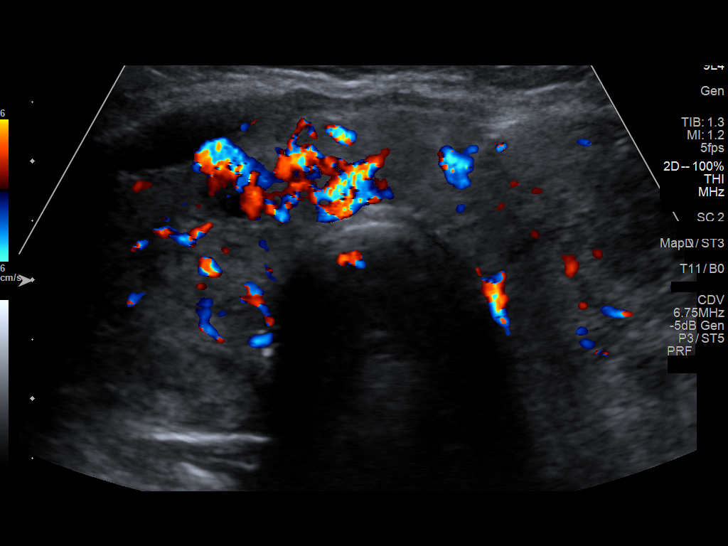
[im 8/46]
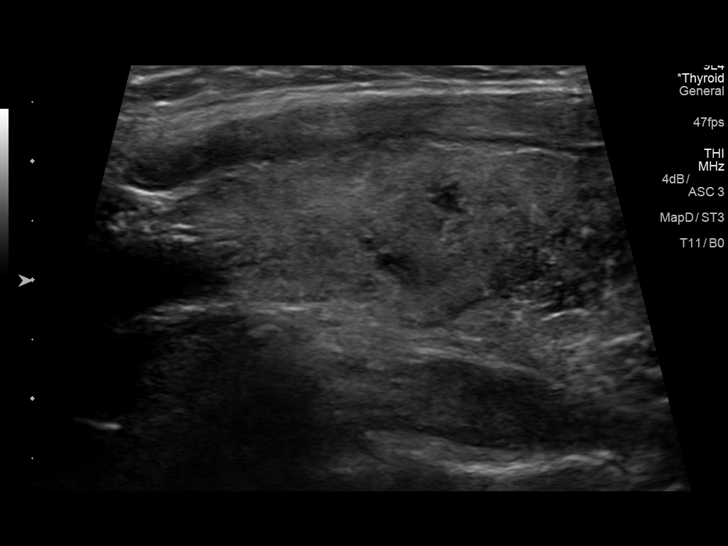
[im 12/46]
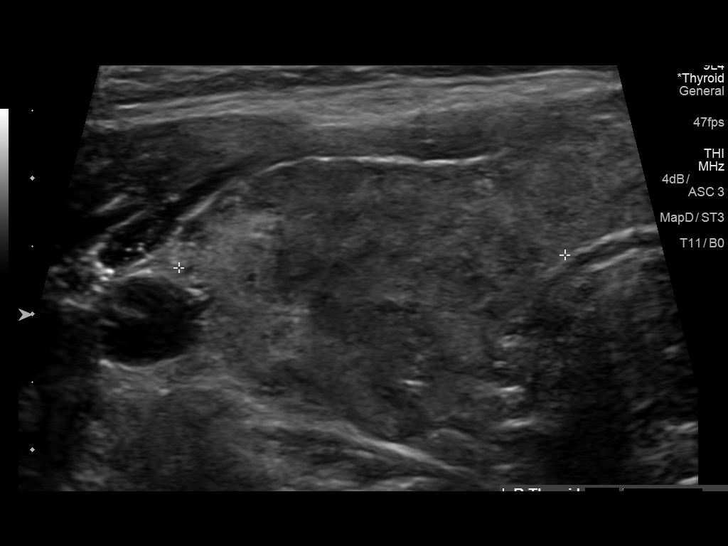
[im 16/46]
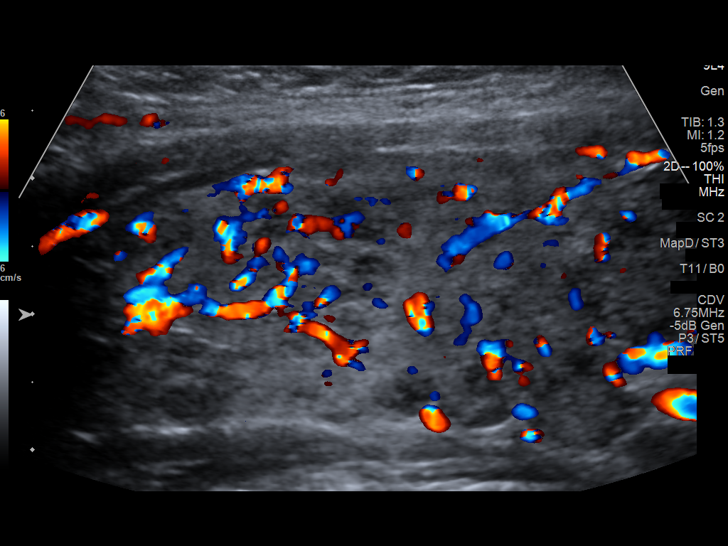
[im 19/46]
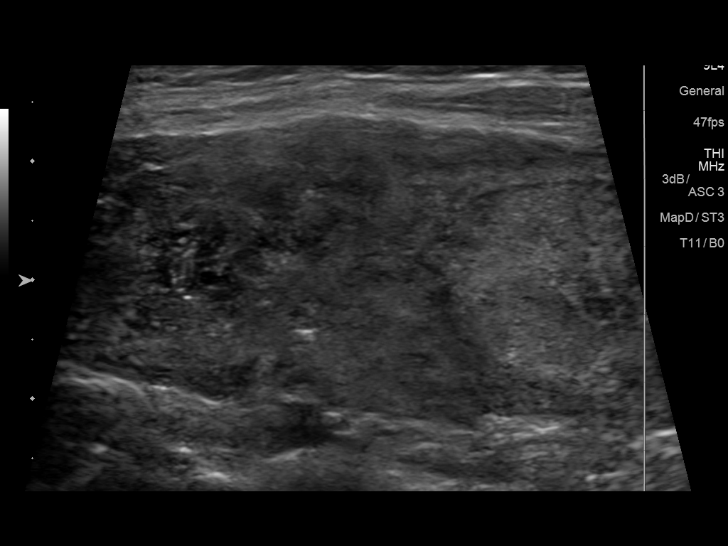
[im 23/46]
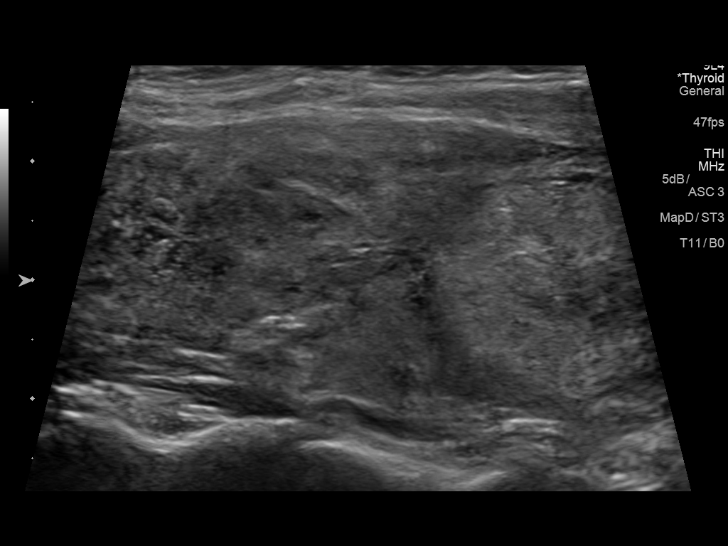
[im 27/46]
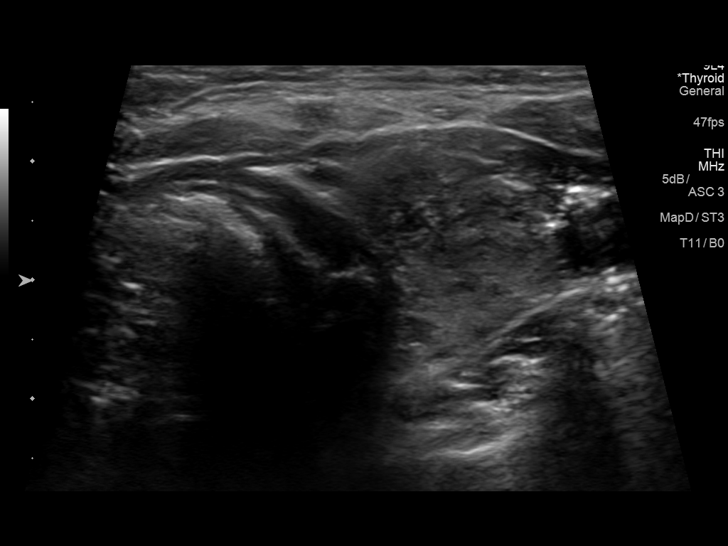
[im 31/46]
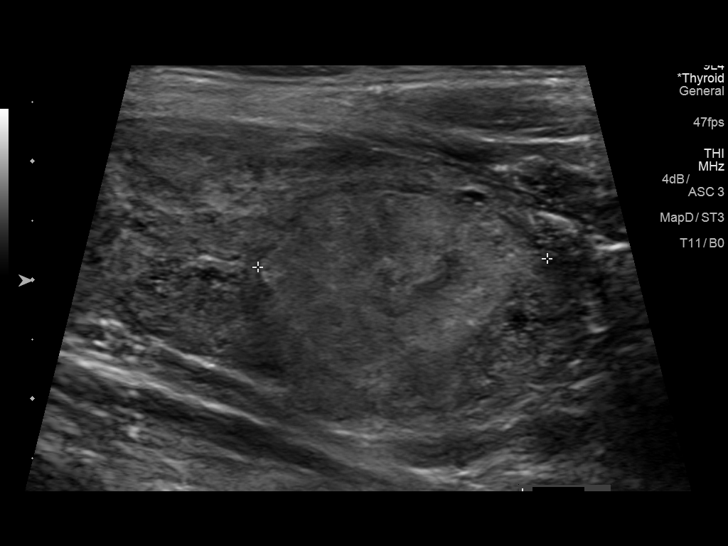
[im 34/46]
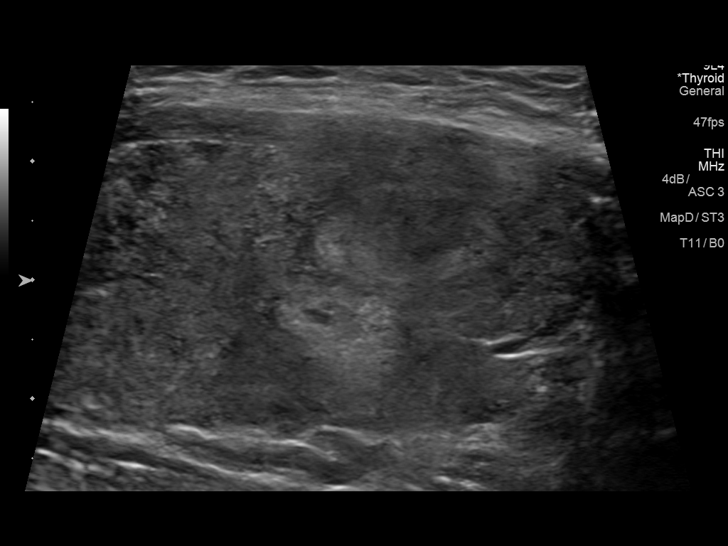
[im 38/46]
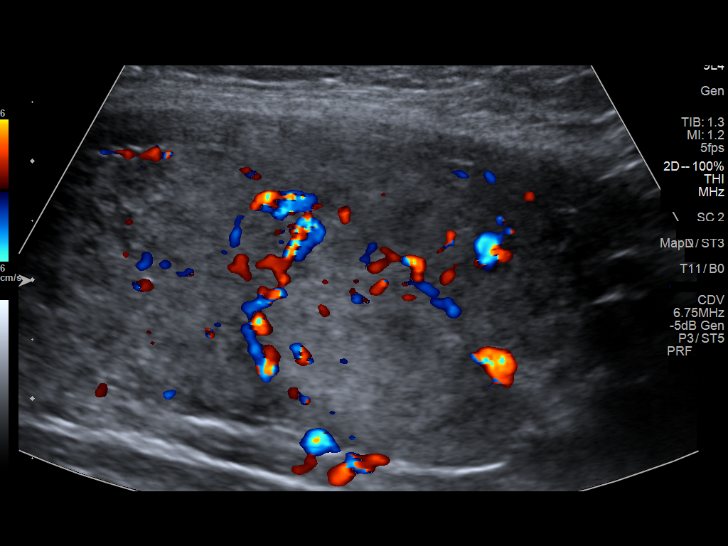
[im 42/46]
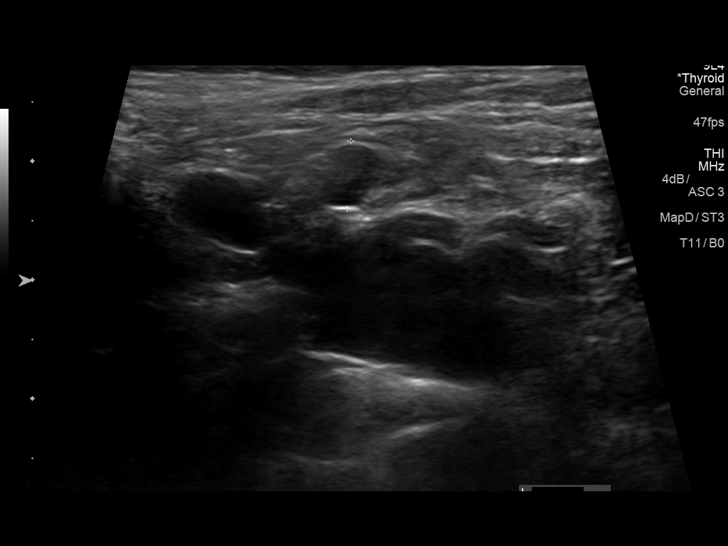
[im 46/46]
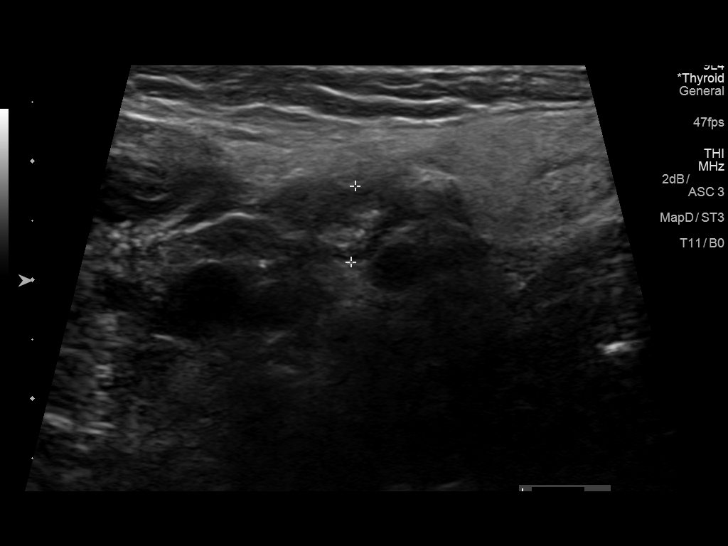

[13 of 25 positions shown; findings below may reference images not displayed]

FINDINGS: Parenchymal Echotexture: Moderately heterogenous

Isthmus: 0.8 cm

Right lobe: 5.0 x 2.0 x 2.8 cm

Left lobe: 5.2 x 2.6 x 2.8 cm

_________________________________________________________

Estimated total number of nodules >/= 1 cm: 2

Number of spongiform nodules >/=  2 cm not described below (TR1): 0

Number of mixed cystic and solid nodules >/= 1.5 cm not described
below (TR2): 0

_________________________________________________________

On today's examination, the left thyroid gland is diffusely
heterogeneous and macrolobular. The measured regions are nearly
inseparable from the background thyroid parenchyma and are isoechoic
in nature. No definite hypoechoic solid nodule is identified. These
nodules are almost certainly representative of pseudo
nodules/diffuse goitrous change. No further follow-up is
recommended.
IMPRESSION: The previously noted thyroid nodules are now not readily apparent
against the background of the diffusely enlarged, heterogeneous and
lobular left thyroid gland. The nodules are strongly felt to
represent pseudo nodules rather than true thyroid nodules.

No further follow-up is recommended.

## 2018-08-02 DIAGNOSIS — D2239 Melanocytic nevi of other parts of face: Secondary | ICD-10-CM | POA: Diagnosis not present

## 2018-08-02 DIAGNOSIS — D2371 Other benign neoplasm of skin of right lower limb, including hip: Secondary | ICD-10-CM | POA: Diagnosis not present

## 2018-08-02 DIAGNOSIS — D220 Melanocytic nevi of lip: Secondary | ICD-10-CM | POA: Diagnosis not present

## 2018-08-02 DIAGNOSIS — L723 Sebaceous cyst: Secondary | ICD-10-CM | POA: Diagnosis not present

## 2018-08-23 DIAGNOSIS — D220 Melanocytic nevi of lip: Secondary | ICD-10-CM | POA: Diagnosis not present

## 2018-08-23 DIAGNOSIS — I781 Nevus, non-neoplastic: Secondary | ICD-10-CM | POA: Diagnosis not present

## 2018-08-27 ENCOUNTER — Other Ambulatory Visit: Payer: Self-pay | Admitting: *Deleted

## 2018-08-27 NOTE — Telephone Encounter (Signed)
Patient calling for refill of Contrave. [facility] [phone].

## 2018-08-27 NOTE — Telephone Encounter (Signed)
Medication refill request: Contrave  Last AEX:  07/23/18 Next AEX: 10/08/18 Last MMG (if hormonal medication request): 07/24/17 Bi-rads 1 Neg  Refill authorized: #70 with 0 RF Please advise

## 2018-08-30 ENCOUNTER — Telehealth (INDEPENDENT_AMBULATORY_CARE_PROVIDER_SITE_OTHER): Payer: BC Managed Care – PPO | Admitting: Obstetrics & Gynecology

## 2018-08-30 ENCOUNTER — Encounter: Payer: Self-pay | Admitting: Obstetrics & Gynecology

## 2018-08-30 VITALS — Ht 65.5 in | Wt 196.0 lb

## 2018-08-30 DIAGNOSIS — Z6832 Body mass index (BMI) 32.0-32.9, adult: Secondary | ICD-10-CM | POA: Diagnosis not present

## 2018-08-30 MED ORDER — NALTREXONE-BUPROPION HCL ER 8-90 MG PO TB12: ORAL_TABLET | ORAL | 1 refills | Status: DC

## 2018-08-30 NOTE — Progress Notes (Signed)
Virtual Visit via Video Note  I connected with Perrin Maltese on 08/30/18 at  4:00 PM EDT by a video enabled telemedicine application and verified that I am speaking with the correct person using two identifiers.  Location: Patient: work Provider: my office   Pt was willing and desirous of video visit and the limitations that may be inherently present with this type of technology.  History of Present Illness: She is having some dizziness.  Denies headache.  She is not having any insomnia.  Denies nausea and constipation.  She is going to change the medication to 1 in the am and 2 in the pm to see if this helps the dizziness especially considering the serum peak levels with these two medications.  Observations/Objective: WNWD WF, NAD  Assessment and Plan: Weight this morning:  196#.  -202# BMI initially:  33.1   BMI today:  32.12  Has been checking blood pressures at home and they have all been normal.  Highest blood pressure was 127/79 and highest pulse was 76.    Follow Up Instructions: Continue Contrave 2 bid, although she is going decrease for at least a few days to 1 tab in am and 2 in pm to see if the dizziness resolves.  Rx to pharmacy for #120/1RF.  Will recheck in 2 months.   I discussed the assessment and treatment plan with the patient. The patient was provided an opportunity to ask questions and all were answered. The patient agreed with the plan and demonstrated an understanding of the instructions.  I provided 15 minutes of non-face-to-face time during this encounter.  Megan Salon, MD

## 2018-08-30 NOTE — Telephone Encounter (Signed)
Patient called and scheduled for a web visit for this afternoon.

## 2018-08-30 NOTE — Telephone Encounter (Signed)
Could you please call pt and see if she is taking the 2 tabs BID.  Will need #120 if that is the case.  Also, has she been checking weekly BPs.  I do want to know the results.  She needs a one month recheck as well.  We discussed doing a web based visit for this.  I can go ahead and do the RF as long as her BPs are ok and I know exactly what dosage she is taking.  Lastly, does she have the ability to check her BP in front of me on the web based follow up visit?  Thanks.

## 2018-10-05 ENCOUNTER — Telehealth: Payer: BC Managed Care – PPO | Admitting: Obstetrics & Gynecology

## 2018-10-07 ENCOUNTER — Ambulatory Visit: Payer: BLUE CROSS/BLUE SHIELD | Admitting: Obstetrics & Gynecology

## 2018-12-13 ENCOUNTER — Other Ambulatory Visit: Payer: Self-pay | Admitting: Obstetrics & Gynecology

## 2018-12-14 NOTE — Telephone Encounter (Signed)
Medication refill request: Estrace Last AEX:  07/23/2018 SM Next AEX: 10/08/2019 Last MMG (if hormonal medication request): 07/24/2017 BIRADS 1 Negative Density A Refill authorized: Pending #90 with 3 refills if appropriate. Please advise   Medication refill request: Provera Last AEX:  07/23/2018 SM Next AEX: 10/08/2019 Last MMG (if hormonal medication request): 07/24/2017 BIRADS 1 Negative Density A Refill authorized: Pending #90 with 3 refills if appropriate. Please advise.

## 2018-12-28 ENCOUNTER — Telehealth: Payer: Self-pay | Admitting: *Deleted

## 2018-12-28 NOTE — Telephone Encounter (Signed)
Call to patient. Patient advised PA request received for Contrave. Patient states she pays for medication out of pocket. Patient saw Dr. Sabra Heck for video visit on 08-30-2018. Patient taking 1 in am and 2 in pm after visit to help with dizziness. Dizziness resolved. Patient state she stopped the medication in September and restarted 1 week ago. Patient states she is currently only taking 1 tablet in the am. Denies dizziness. Has refill at the pharmacy. RN advised Dr. Sabra Heck recommended 2 month follow up following appointment in July. Patient declines to schedule stating she doesn't think she needs it because dizziness has resolved and she lost 12 pounds. RN advised would update Dr. Sabra Heck and return call if additional recommendations. Patient agreeable.   Routing to provider to review and advise.

## 2018-12-28 NOTE — Telephone Encounter (Signed)
MyChart Video Visit for follow up scheduled 01-03-2019 at 1630. Patient agreeable to date and time of appointment and aware to log in 10-15 minutes prior to appointment to make sure no technical issues. Patient agreeable.   Routing to provider and will close encounter.

## 2018-12-28 NOTE — Telephone Encounter (Signed)
She needs a follow up to continue the medication.  Regular monitoring of patients on contrave is recommended.  Therefore, she will need virtual visit or in person visit (whichever she prefers) prior to refill.  I will need to see her every 2 months while on the medication.  Thanks.

## 2019-01-03 ENCOUNTER — Telehealth (INDEPENDENT_AMBULATORY_CARE_PROVIDER_SITE_OTHER): Payer: BC Managed Care – PPO | Admitting: Obstetrics & Gynecology

## 2019-01-03 ENCOUNTER — Encounter: Payer: Self-pay | Admitting: Obstetrics & Gynecology

## 2019-01-03 DIAGNOSIS — Z6832 Body mass index (BMI) 32.0-32.9, adult: Secondary | ICD-10-CM | POA: Diagnosis not present

## 2019-01-03 MED ORDER — NALTREXONE-BUPROPION HCL ER 8-90 MG PO TB12: ORAL_TABLET | ORAL | 1 refills | Status: DC

## 2019-01-03 NOTE — Progress Notes (Signed)
Virtual Visit via Telephone Note  I connected with Erin Cole on 01/03/19 at  4:30 PM EST by telephone and verified that I am speaking with the correct person using two identifiers.  Location: Patient: home Provider: office   I discussed the limitations, risks, security and privacy concerns of performing an evaluation and management service by telephone and the availability of in person appointments. I also discussed with the patient that there may be a patient responsible charge related to this service. The patient expressed understanding and agreed to proceed.  History of Present Illness: 53 yo who has been taking Contrave for weight loss who is in need of refill.  She started on this in the summer and had some dizziness with the 2 tabs BID dosing.  She decreased to one in the AM and two at night and this worked well.  Then in August, she had a GI illness and stopped the medication.  She recently decided to restart the prescription as she still had some remaining.  She has been taking one a day.     She got down to #187 prior to the GI issue (which has resolved) but is at #194 today.  She started at #202.     Observations/Objective: Blood pressures were normal.  They are typically 120's/65 -75 range.  She did check them while on the contrave and never had any issues with this.  Assessment and Plan: BMI 32  Follow Up Instructions: Restart Contrave and titrate up dosing just like when initially starting. She is taking one a day and will increase to 1 tab BID x 7 days, then 2 in am (or PM) and one at the other time of the time.  She can try the 2 tabs BID again but if has dizziness, she will decrease.    Will recheck in 2 months again with her.  She will monitor BPs again.   I discussed the assessment and treatment plan with the patient. The patient was provided an opportunity to ask questions and all were answered. The patient agreed with the plan and demonstrated an understanding  of the instructions.   The patient was advised to call back or seek an in-person evaluation if the symptoms worsen or if the condition fails to improve as anticipated.  I provided 12 minutes of non-face-to-face time during this encounter.   Megan Salon, MD

## 2019-01-06 ENCOUNTER — Telehealth: Payer: Self-pay | Admitting: Obstetrics & Gynecology

## 2019-01-06 NOTE — Telephone Encounter (Signed)
PA submitted to plan for Contrave via [email or web address] Key: [id number]  Dx: BMI 32.0 -32.9

## 2019-01-06 NOTE — Telephone Encounter (Signed)
Patient is calling regarding medication refill for Contrave. Patient stated that the notice from the pharmacy stated that a prior authorization is required.

## 2019-01-07 NOTE — Telephone Encounter (Signed)
PA response received from [facility]. PA approved for Contrave 8-90MG  OR TB12. Valid 01/06/19 -07/04/19.   Patient notified.   Copy of Authorization to scan.   Routing to provider for final review. Patient is agreeable to disposition. Will close encounter.

## 2019-06-28 ENCOUNTER — Other Ambulatory Visit: Payer: Self-pay | Admitting: Obstetrics & Gynecology

## 2019-06-28 DIAGNOSIS — Z1231 Encounter for screening mammogram for malignant neoplasm of breast: Secondary | ICD-10-CM

## 2019-07-06 ENCOUNTER — Ambulatory Visit
Admission: RE | Admit: 2019-07-06 | Discharge: 2019-07-06 | Disposition: A | Discharge status: Home / Self Care | Payer: BC Managed Care – PPO | Source: Ambulatory Visit | Attending: Obstetrics & Gynecology | Admitting: Obstetrics & Gynecology

## 2019-07-06 ENCOUNTER — Other Ambulatory Visit: Payer: Self-pay

## 2019-07-06 DIAGNOSIS — Z1231 Encounter for screening mammogram for malignant neoplasm of breast: Secondary | ICD-10-CM | POA: Diagnosis not present
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
## 2019-10-07 ENCOUNTER — Other Ambulatory Visit: Payer: Self-pay

## 2019-10-07 ENCOUNTER — Ambulatory Visit (INDEPENDENT_AMBULATORY_CARE_PROVIDER_SITE_OTHER): Payer: BC Managed Care – PPO | Admitting: Obstetrics & Gynecology

## 2019-10-07 ENCOUNTER — Encounter: Payer: Self-pay | Admitting: Obstetrics & Gynecology

## 2019-10-07 VITALS — BP 104/68 | HR 70 | Resp 16 | Ht 65.75 in | Wt 181.0 lb

## 2019-10-07 DIAGNOSIS — E785 Hyperlipidemia, unspecified: Secondary | ICD-10-CM | POA: Diagnosis not present

## 2019-10-07 DIAGNOSIS — Z23 Encounter for immunization: Secondary | ICD-10-CM | POA: Diagnosis not present

## 2019-10-07 DIAGNOSIS — Z0000 Encounter for general adult medical examination without abnormal findings: Secondary | ICD-10-CM | POA: Diagnosis not present

## 2019-10-07 DIAGNOSIS — Z01419 Encounter for gynecological examination (general) (routine) without abnormal findings: Secondary | ICD-10-CM | POA: Diagnosis not present

## 2019-10-07 MED ORDER — MEDROXYPROGESTERONE ACETATE 2.5 MG PO TABS: ORAL_TABLET | ORAL | 3 refills | Status: DC

## 2019-10-07 MED ORDER — ESTRADIOL 0.5 MG PO TABS
ORAL_TABLET | ORAL | 3 refills | Status: DC
Start: 2019-10-07 — End: 2020-04-16

## 2019-10-08 LAB — COMPREHENSIVE METABOLIC PANEL
ALT: 16 IU/L (ref 0–32)
AST: 20 IU/L (ref 0–40)
Albumin/Globulin Ratio: 1.8 (ref 1.2–2.2)
Albumin: 4.8 g/dL (ref 3.8–4.9)
Alkaline Phosphatase: 79 IU/L (ref 48–121)
BUN/Creatinine Ratio: 16 (ref 9–23)
BUN: 13 mg/dL (ref 6–24)
Bilirubin Total: 0.3 mg/dL (ref 0.0–1.2)
CO2: 24 mmol/L (ref 20–29)
Calcium: 10.3 mg/dL — ABNORMAL HIGH (ref 8.7–10.2)
Chloride: 104 mmol/L (ref 96–106)
Creatinine, Ser: 0.8 mg/dL (ref 0.57–1.00)
GFR calc Af Amer: 97 mL/min/{1.73_m2} (ref >59)
GFR calc non Af Amer: 84 mL/min/{1.73_m2} (ref >59)
Globulin, Total: 2.7 g/dL (ref 1.5–4.5)
Glucose: 95 mg/dL (ref 65–99)
Potassium: 4.4 mmol/L (ref 3.5–5.2)
Sodium: 141 mmol/L (ref 134–144)
Total Protein: 7.5 g/dL (ref 6.0–8.5)

## 2019-10-08 LAB — LIPID PANEL
Chol/HDL Ratio: 5.2 ratio — ABNORMAL HIGH (ref 0.0–4.4)
Cholesterol, Total: 238 mg/dL — ABNORMAL HIGH (ref 100–199)
HDL: 46 mg/dL (ref >39)
LDL Chol Calc (NIH): 170 mg/dL — ABNORMAL HIGH (ref 0–99)
Triglycerides: 120 mg/dL (ref 0–149)
VLDL Cholesterol Cal: 22 mg/dL (ref 5–40)

## 2019-10-20 ENCOUNTER — Telehealth: Payer: Self-pay

## 2019-10-20 NOTE — Telephone Encounter (Signed)
Pcp is [facility]

## 2019-10-20 NOTE — Telephone Encounter (Signed)
Left message for call back.

## 2019-10-20 NOTE — Telephone Encounter (Signed)
-----   Message from Jerene Bears, MD sent at 10/19/2019  4:27 PM EDT ----- Ander Slade, I don't know which specific Dr. Farris Has she sees and there are too many in Epic to guess.  Can you confirm the providers name and address so I send it to the correct provider?  Thanks.  Rosalita Chessman ----- Message ----- From: Eliezer Bottom, CMA Sent: 10/11/2019   9:37 AM EDT To: Jerene Bears, MD  Patient notified of results as written by provider. She stated she has an appointment with her pcp on October 1st & said she can have follow up & PTH labwork done there. She would like for you to send her labs to her pcp.

## 2019-11-17 ENCOUNTER — Other Ambulatory Visit: Payer: Self-pay

## 2019-11-18 ENCOUNTER — Encounter: Payer: BC Managed Care – PPO | Admitting: Family Medicine

## 2019-12-15 ENCOUNTER — Other Ambulatory Visit: Payer: Self-pay

## 2019-12-15 NOTE — Telephone Encounter (Signed)
Patient states she needs a refill of anxiety medication. Patient states last time she has taken it was last year.

## 2019-12-16 MED ORDER — ALPRAZOLAM 0.5 MG PO TABS
ORAL_TABLET | ORAL | 0 refills | Status: DC
Start: 2019-12-16 — End: 2020-04-16

## 2019-12-16 NOTE — Telephone Encounter (Signed)
Spoke with pt. Pt given update on Rx sent to pharmacy. Pt agreeable and verbalized understanding.  Encounter closed

## 2019-12-16 NOTE — Telephone Encounter (Signed)
Spoke with patient. Patient reports hx of anxiety. Her father is in the hospital, states this is contributing to increased anxiety. Denies depression, SI/HI. Patient is requesting refill of Xanax 0.5mg  tab. Patient states it has been a few years since she has filled medication, uses PRN. Marland Kitchen Advised patient I will forward request to Dr. Hyacinth Meeker to review, our office will f/u with recommendations. Patient is aware provider is seeing patients, response may not be immediate. Patient agreeable. Confirmed pharmacy on file.  Xanax 0.5mg  PO tab at hs as need for anxiety, last sent 03/27/16, #30/0RF  Last AEX 10/07/19   Dr. Hyacinth Meeker -please advise on Rx.

## 2019-12-16 NOTE — Telephone Encounter (Signed)
Rx was sent to pharmacy for #30/0RF, xanax 0.5mg  no more than twice daily.  Please let her know.  Thanks.

## 2019-12-16 NOTE — Telephone Encounter (Signed)
Patient is calling to check status of wanting refill.

## 2020-04-16 ENCOUNTER — Ambulatory Visit (INDEPENDENT_AMBULATORY_CARE_PROVIDER_SITE_OTHER): Payer: BC Managed Care – PPO | Admitting: Obstetrics & Gynecology

## 2020-04-16 ENCOUNTER — Other Ambulatory Visit: Payer: Self-pay

## 2020-04-16 ENCOUNTER — Encounter (HOSPITAL_BASED_OUTPATIENT_CLINIC_OR_DEPARTMENT_OTHER): Payer: Self-pay | Admitting: Obstetrics & Gynecology

## 2020-04-16 VITALS — BP 124/72 | HR 65 | Ht 67.0 in | Wt 183.2 lb

## 2020-04-16 DIAGNOSIS — Z79890 Hormone replacement therapy (postmenopausal): Secondary | ICD-10-CM | POA: Diagnosis not present

## 2020-04-16 DIAGNOSIS — F4321 Adjustment disorder with depressed mood: Secondary | ICD-10-CM

## 2020-04-16 DIAGNOSIS — F4320 Adjustment disorder, unspecified: Secondary | ICD-10-CM | POA: Insufficient documentation

## 2020-04-16 MED ORDER — ALPRAZOLAM 0.5 MG PO TABS: ORAL_TABLET | ORAL | 0 refills | Status: DC

## 2020-04-16 MED ORDER — MEDROXYPROGESTERONE ACETATE 5 MG PO TABS: ORAL_TABLET | ORAL | 1 refills | Status: DC

## 2020-04-16 MED ORDER — ESTRADIOL 1 MG PO TABS: ORAL_TABLET | ORAL | 1 refills | Status: DC
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
## 2020-07-25 ENCOUNTER — Other Ambulatory Visit (HOSPITAL_BASED_OUTPATIENT_CLINIC_OR_DEPARTMENT_OTHER): Payer: Self-pay | Admitting: Obstetrics & Gynecology

## 2020-07-25 DIAGNOSIS — Z79890 Hormone replacement therapy (postmenopausal): Secondary | ICD-10-CM

## 2020-07-25 NOTE — Telephone Encounter (Signed)
Erin Cole is a 55 y.o. female was contacted to confirm medication request for estradiol 1mg  tab. Pt said she did make this request and is currently taking expired medication. Medication was sent to [facility]. Pt also scheduled an annual exam for September 17 2020 @ 11:15pm

## 2020-08-31 ENCOUNTER — Encounter (HOSPITAL_BASED_OUTPATIENT_CLINIC_OR_DEPARTMENT_OTHER): Payer: Self-pay | Admitting: *Deleted

## 2020-08-31 NOTE — Progress Notes (Signed)
Received notification that patient is overdue for Cologuard testing. Pt has an appt Aug 2022 and testing will be discussed then.

## 2020-10-09 ENCOUNTER — Ambulatory Visit (HOSPITAL_BASED_OUTPATIENT_CLINIC_OR_DEPARTMENT_OTHER): Payer: BC Managed Care – PPO | Admitting: Obstetrics & Gynecology

## 2020-10-10 ENCOUNTER — Other Ambulatory Visit: Payer: Self-pay

## 2020-10-10 ENCOUNTER — Encounter (HOSPITAL_BASED_OUTPATIENT_CLINIC_OR_DEPARTMENT_OTHER): Payer: Self-pay | Admitting: Obstetrics & Gynecology

## 2020-10-10 ENCOUNTER — Ambulatory Visit (INDEPENDENT_AMBULATORY_CARE_PROVIDER_SITE_OTHER): Payer: BC Managed Care – PPO | Admitting: Obstetrics & Gynecology

## 2020-10-10 ENCOUNTER — Other Ambulatory Visit (HOSPITAL_COMMUNITY)
Admission: RE | Admit: 2020-10-10 | Discharge: 2020-10-10 | Disposition: A | Discharge status: Home / Self Care | Payer: BC Managed Care – PPO | Source: Ambulatory Visit | Attending: Obstetrics & Gynecology | Admitting: Obstetrics & Gynecology

## 2020-10-10 ENCOUNTER — Encounter (HOSPITAL_BASED_OUTPATIENT_CLINIC_OR_DEPARTMENT_OTHER): Payer: Self-pay

## 2020-10-10 VITALS — BP 134/74 | HR 75 | Ht 65.25 in | Wt 192.0 lb

## 2020-10-10 DIAGNOSIS — Z79890 Hormone replacement therapy (postmenopausal): Secondary | ICD-10-CM | POA: Diagnosis not present

## 2020-10-10 DIAGNOSIS — B977 Papillomavirus as the cause of diseases classified elsewhere: Secondary | ICD-10-CM | POA: Diagnosis not present

## 2020-10-10 DIAGNOSIS — Z1211 Encounter for screening for malignant neoplasm of colon: Secondary | ICD-10-CM

## 2020-10-10 DIAGNOSIS — Z124 Encounter for screening for malignant neoplasm of cervix: Secondary | ICD-10-CM

## 2020-10-10 DIAGNOSIS — Z01419 Encounter for gynecological examination (general) (routine) without abnormal findings: Secondary | ICD-10-CM

## 2020-10-10 DIAGNOSIS — R635 Abnormal weight gain: Secondary | ICD-10-CM
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
## 2020-10-11 LAB — T4, FREE: Free T4: 1.03 ng/dL (ref 0.82–1.77)

## 2020-10-11 LAB — TSH: TSH: 2.54 u[IU]/mL (ref 0.450–4.500)

## 2020-10-12 DIAGNOSIS — B977 Papillomavirus as the cause of diseases classified elsewhere: Secondary | ICD-10-CM | POA: Insufficient documentation

## 2020-10-12 LAB — CYTOLOGY - PAP
Adequacy: ABSENT
Comment: NEGATIVE
Diagnosis: NEGATIVE
High risk HPV: NEGATIVE

## 2020-10-12 MED ORDER — ESTRADIOL 1 MG PO TABS: ORAL_TABLET | ORAL | 3 refills | Status: DC

## 2020-10-12 MED ORDER — MEDROXYPROGESTERONE ACETATE 5 MG PO TABS: ORAL_TABLET | ORAL | 3 refills | Status: DC

## 2020-12-26 ENCOUNTER — Telehealth: Payer: Self-pay | Admitting: Family Medicine

## 2020-12-26 NOTE — Telephone Encounter (Signed)
Spoke to patient, she is concerned about a rash on the back of both legs (pin like spots that hurt).  + covid on 12/22/20.  Spoke to provider, advised that an Virtual appointment would be good to evaluate this and would need to have an in person appointment or go to an Urgent care.   She wil wait another day if no better will call to see about getting an appointment.   Dm/cma

## 2020-12-26 NOTE — Telephone Encounter (Signed)
Pt has covid and just wanted to speak with a nurse about a rash she's experiencing from it.   [phone]

## 2021-03-07 ENCOUNTER — Encounter (HOSPITAL_BASED_OUTPATIENT_CLINIC_OR_DEPARTMENT_OTHER): Payer: Self-pay | Admitting: Obstetrics & Gynecology

## 2021-04-10 DIAGNOSIS — I781 Nevus, non-neoplastic: Secondary | ICD-10-CM | POA: Diagnosis not present

## 2021-04-10 DIAGNOSIS — D229 Melanocytic nevi, unspecified: Secondary | ICD-10-CM | POA: Diagnosis not present

## 2021-07-12 ENCOUNTER — Ambulatory Visit (INDEPENDENT_AMBULATORY_CARE_PROVIDER_SITE_OTHER): Payer: BC Managed Care – PPO | Admitting: Family Medicine

## 2021-07-12 ENCOUNTER — Encounter: Payer: Self-pay | Admitting: Family Medicine

## 2021-07-12 VITALS — BP 118/74 | HR 79 | Temp 97.8°F | Ht 65.0 in | Wt 197.6 lb

## 2021-07-12 DIAGNOSIS — E7800 Pure hypercholesterolemia, unspecified: Secondary | ICD-10-CM | POA: Diagnosis not present

## 2021-07-12 DIAGNOSIS — Z1231 Encounter for screening mammogram for malignant neoplasm of breast: Secondary | ICD-10-CM | POA: Diagnosis not present

## 2021-07-12 DIAGNOSIS — E559 Vitamin D deficiency, unspecified: Secondary | ICD-10-CM

## 2021-07-12 DIAGNOSIS — R5383 Other fatigue: Secondary | ICD-10-CM | POA: Diagnosis not present

## 2021-07-12 DIAGNOSIS — E010 Iodine-deficiency related diffuse (endemic) goiter: Secondary | ICD-10-CM | POA: Diagnosis not present

## 2021-07-12 DIAGNOSIS — Z0000 Encounter for general adult medical examination without abnormal findings: Secondary | ICD-10-CM

## 2021-07-12 DIAGNOSIS — E785 Hyperlipidemia, unspecified: Secondary | ICD-10-CM | POA: Insufficient documentation

## 2021-07-12 LAB — URINALYSIS, ROUTINE W REFLEX MICROSCOPIC
Bilirubin Urine: NEGATIVE
Ketones, ur: NEGATIVE
Leukocytes,Ua: NEGATIVE
Nitrite: NEGATIVE
Specific Gravity, Urine: <=1.005 — AB (ref 1.000–1.030)
Total Protein, Urine: NEGATIVE
Urine Glucose: NEGATIVE
Urobilinogen, UA: 0.2 (ref 0.0–1.0)
pH: 6 (ref 5.0–8.0)

## 2021-07-12 LAB — COMPREHENSIVE METABOLIC PANEL
ALT: 11 U/L (ref 0–35)
AST: 16 U/L (ref 0–37)
Albumin: 4.6 g/dL (ref 3.5–5.2)
Alkaline Phosphatase: 60 U/L (ref 39–117)
BUN: 13 mg/dL (ref 6–23)
CO2: 28 mEq/L (ref 19–32)
Calcium: 10.5 mg/dL (ref 8.4–10.5)
Chloride: 105 mEq/L (ref 96–112)
Creatinine, Ser: 0.87 mg/dL (ref 0.40–1.20)
GFR: 75.25 mL/min (ref >60.00)
Glucose, Bld: 97 mg/dL (ref 70–99)
Potassium: 4.7 mEq/L (ref 3.5–5.1)
Sodium: 142 mEq/L (ref 135–145)
Total Bilirubin: 0.6 mg/dL (ref 0.2–1.2)
Total Protein: 7.4 g/dL (ref 6.0–8.3)

## 2021-07-12 LAB — CBC
HCT: 42.1 % (ref 36.0–46.0)
Hemoglobin: 14 g/dL (ref 12.0–15.0)
MCHC: 33.2 g/dL (ref 30.0–36.0)
MCV: 90.9 fl (ref 78.0–100.0)
Platelets: 285 10*3/uL (ref 150.0–400.0)
RBC: 4.64 Mil/uL (ref 3.87–5.11)
RDW: 13 % (ref 11.5–15.5)
WBC: 6 10*3/uL (ref 4.0–10.5)

## 2021-07-12 LAB — T3, FREE: T3, Free: 3.2 pg/mL (ref 2.3–4.2)

## 2021-07-12 LAB — LIPID PANEL
Cholesterol: 235 mg/dL — ABNORMAL HIGH (ref 0–200)
HDL: 46.2 mg/dL (ref >39.00)
LDL Cholesterol: 158 mg/dL — ABNORMAL HIGH (ref 0–99)
NonHDL: 188.62
Total CHOL/HDL Ratio: 5
Triglycerides: 153 mg/dL — ABNORMAL HIGH (ref 0.0–149.0)
VLDL: 30.6 mg/dL (ref 0.0–40.0)

## 2021-07-12 LAB — TSH: TSH: 1.7 u[IU]/mL (ref 0.35–5.50)

## 2021-07-12 LAB — VITAMIN D 25 HYDROXY (VIT D DEFICIENCY, FRACTURES): VITD: 25.98 ng/mL — ABNORMAL LOW (ref 30.00–100.00)

## 2021-07-12 MED ORDER — LEVOTHYROXINE SODIUM 25 MCG PO TABS: 25.0000 ug | ORAL_TABLET | Freq: Every day | ORAL | 3 refills | Status: DC

## 2021-07-12 NOTE — Progress Notes (Signed)
Established Patient Office Visit  Subjective   Patient ID: Erin Cole, female    DOB: May 22, 1965  Age: 56 y.o. MRN: [id number]  Chief Complaint  Patient presents with   Thyroid Problem    Concerns about possible elevated TSH levels, continued weight gain and fatigue all the time.     Thyroid Problem Patient reports no diaphoresis or weight loss.  here for follow-up of ongoing fatigue with weight gain.  She is concerned for thyroid.  History of thyroid megaly.  Mom has thyroid disease.  Chart review shows ultrasound of thyroid in 2019 enlargement.  Patient exercises regularly.  She documents the food she eats and still has difficulty losing weight.  She still feels some grief after the loss of her father a year and a half ago.  He remains close with her pends also my patient.  She is married.  She has 2 stepsons 40s.  Her 65 year old daughter just graduated from college last year.  Regular female care through GYN.    Review of Systems  Constitutional:  Positive for malaise/fatigue. Negative for chills, diaphoresis and weight loss.  HENT: Negative.    Eyes: Negative.  Negative for blurred vision and double vision.  Respiratory: Negative.    Cardiovascular:  Negative for chest pain.  Gastrointestinal:  Negative for abdominal pain.  Genitourinary: Negative.   Musculoskeletal:  Negative for falls and myalgias.  Neurological:  Negative for speech change, loss of consciousness and weakness.  Psychiatric/Behavioral: Negative.       07/12/2021    9:34 AM 10/10/2020    8:10 AM 10/26/2017    2:51 PM  Depression screen PHQ 2/9  Decreased Interest 0 0 0  Down, Depressed, Hopeless 0 0 0  PHQ - 2 Score 0 0 0       Objective:     BP 118/74 (BP Location: Right Arm, Patient Position: Sitting, Cuff Size: Large)   Pulse 79   Temp 97.8 F (36.6 C) (Temporal)   Ht 5\' 5"  (1.651 m)   Wt 197 lb 9.6 oz (89.6 kg)   LMP 08/17/2012 (Approximate)   SpO2 96%   BMI 32.88 kg/m    Physical  Exam Constitutional:      General: She is not in acute distress.    Appearance: Normal appearance. She is not ill-appearing, toxic-appearing or diaphoretic.  HENT:     Head: Normocephalic and atraumatic.     Right Ear: External ear normal.     Left Ear: External ear normal.     Mouth/Throat:     Mouth: Mucous membranes are moist.     Pharynx: Oropharynx is clear. No oropharyngeal exudate or posterior oropharyngeal erythema.  Eyes:     General: No scleral icterus.       Right eye: No discharge.        Left eye: No discharge.     Extraocular Movements: Extraocular movements intact.     Conjunctiva/sclera: Conjunctivae normal.     Pupils: Pupils are equal, round, and reactive to light.  Neck:     Thyroid: Thyromegaly present.  Cardiovascular:     Rate and Rhythm: Normal rate and regular rhythm.  Pulmonary:     Effort: Pulmonary effort is normal. No respiratory distress.     Breath sounds: Normal breath sounds.  Abdominal:     General: Bowel sounds are normal.  Musculoskeletal:     Cervical back: No rigidity or tenderness.  Skin:    General: Skin is warm and dry.  Neurological:     Mental Status: She is alert and oriented to person, place, and time.  Psychiatric:        Mood and Affect: Mood normal.        Behavior: Behavior normal.     No results found for any visits on 07/12/21.    The 10-year ASCVD risk score (Arnett DK, et al., 2019) is: 2.2%    Assessment & Plan:   Problem List Items Addressed This Visit       Endocrine   Thyromegaly   Relevant Medications   levothyroxine (SYNTHROID) 25 MCG tablet   Other Relevant Orders   TSH   T3, free     Other   Encounter for screening mammogram for malignant neoplasm of breast   Relevant Orders   MM 3D SCREEN BREAST BILATERAL   Vitamin D deficiency   Relevant Orders   VITAMIN D 25 Hydroxy (Vit-D Deficiency, Fractures)   Other fatigue - Primary   Relevant Medications   levothyroxine (SYNTHROID) 25 MCG tablet    Other Relevant Orders   CBC   Comprehensive metabolic panel   Urinalysis, Routine w reflex microscopic   TSH   T3, free   Elevated LDL cholesterol level   Relevant Orders   Lipid panel    Return in about 6 months (around 01/12/2022), or if symptoms worsen or fail to improve.  Information was given on managing high cholesterol with diet as well as fatigue.  We will try low-dose levothyroxine to see if that will help and prevent any progression of her thyroid megaly.  Discussed taking the tablet on a fasting stomach each morning.  Discussed starting a statin with her elevated LDL cholesterol.   Mliss Sax, MD

## 2021-07-23 ENCOUNTER — Ambulatory Visit
Admission: RE | Admit: 2021-07-23 | Discharge: 2021-07-23 | Disposition: A | Discharge status: Home / Self Care | Payer: BC Managed Care – PPO | Source: Ambulatory Visit | Attending: Family Medicine | Admitting: Family Medicine

## 2021-07-23 DIAGNOSIS — Z1231 Encounter for screening mammogram for malignant neoplasm of breast: Secondary | ICD-10-CM | POA: Diagnosis not present

## 2021-08-08 ENCOUNTER — Other Ambulatory Visit (HOSPITAL_BASED_OUTPATIENT_CLINIC_OR_DEPARTMENT_OTHER): Payer: Self-pay | Admitting: Obstetrics & Gynecology

## 2021-08-08 DIAGNOSIS — F4321 Adjustment disorder with depressed mood: Secondary | ICD-10-CM

## 2021-08-09 MED ORDER — ALPRAZOLAM 0.5 MG PO TABS: ORAL_TABLET | ORAL | 0 refills | Status: DC

## 2021-10-31 ENCOUNTER — Other Ambulatory Visit (HOSPITAL_BASED_OUTPATIENT_CLINIC_OR_DEPARTMENT_OTHER): Payer: Self-pay | Admitting: Obstetrics & Gynecology

## 2021-10-31 DIAGNOSIS — Z79890 Hormone replacement therapy (postmenopausal): Secondary | ICD-10-CM

## 2021-11-04 ENCOUNTER — Encounter (HOSPITAL_BASED_OUTPATIENT_CLINIC_OR_DEPARTMENT_OTHER): Payer: Self-pay | Admitting: Obstetrics & Gynecology

## 2021-11-04 ENCOUNTER — Other Ambulatory Visit (HOSPITAL_BASED_OUTPATIENT_CLINIC_OR_DEPARTMENT_OTHER): Payer: Self-pay | Admitting: Obstetrics & Gynecology

## 2021-11-04 DIAGNOSIS — Z79890 Hormone replacement therapy (postmenopausal): Secondary | ICD-10-CM

## 2021-11-04 MED ORDER — MEDROXYPROGESTERONE ACETATE 5 MG PO TABS: ORAL_TABLET | ORAL | 0 refills | Status: DC

## 2021-11-11 ENCOUNTER — Encounter (HOSPITAL_BASED_OUTPATIENT_CLINIC_OR_DEPARTMENT_OTHER): Payer: Self-pay | Admitting: Obstetrics & Gynecology

## 2021-11-11 ENCOUNTER — Ambulatory Visit (INDEPENDENT_AMBULATORY_CARE_PROVIDER_SITE_OTHER): Payer: BC Managed Care – PPO | Admitting: Obstetrics & Gynecology

## 2021-11-11 VITALS — BP 129/70 | HR 75 | Ht 67.0 in | Wt 205.4 lb

## 2021-11-11 DIAGNOSIS — Z79890 Hormone replacement therapy (postmenopausal): Secondary | ICD-10-CM | POA: Diagnosis not present

## 2021-11-11 DIAGNOSIS — Z8619 Personal history of other infectious and parasitic diseases: Secondary | ICD-10-CM

## 2021-11-11 DIAGNOSIS — F4321 Adjustment disorder with depressed mood: Secondary | ICD-10-CM

## 2021-11-11 DIAGNOSIS — Z23 Encounter for immunization: Secondary | ICD-10-CM | POA: Diagnosis not present

## 2021-11-11 DIAGNOSIS — Z01419 Encounter for gynecological examination (general) (routine) without abnormal findings: Secondary | ICD-10-CM | POA: Diagnosis not present

## 2021-11-11 DIAGNOSIS — Z1211 Encounter for screening for malignant neoplasm of colon: Secondary | ICD-10-CM

## 2021-11-11 MED ORDER — ESTRADIOL 1 MG PO TABS: ORAL_TABLET | ORAL | 3 refills | Status: DC

## 2021-11-11 MED ORDER — ALPRAZOLAM 0.5 MG PO TABS: ORAL_TABLET | ORAL | 0 refills | Status: DC

## 2021-11-11 MED ORDER — MEDROXYPROGESTERONE ACETATE 5 MG PO TABS: ORAL_TABLET | ORAL | 3 refills | Status: DC
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
## 2021-11-22 DIAGNOSIS — Z1211 Encounter for screening for malignant neoplasm of colon: Secondary | ICD-10-CM | POA: Diagnosis not present

## 2021-11-25 ENCOUNTER — Ambulatory Visit (HOSPITAL_BASED_OUTPATIENT_CLINIC_OR_DEPARTMENT_OTHER): Payer: BC Managed Care – PPO | Admitting: Obstetrics & Gynecology

## 2021-11-29 LAB — COLOGUARD: COLOGUARD: NEGATIVE

## 2022-01-16 ENCOUNTER — Ambulatory Visit (INDEPENDENT_AMBULATORY_CARE_PROVIDER_SITE_OTHER): Payer: BC Managed Care – PPO | Admitting: Family Medicine

## 2022-01-16 ENCOUNTER — Encounter: Payer: Self-pay | Admitting: Family Medicine

## 2022-01-16 VITALS — BP 122/70 | HR 98 | Temp 96.9°F | Ht 67.0 in | Wt 205.4 lb

## 2022-01-16 DIAGNOSIS — E559 Vitamin D deficiency, unspecified: Secondary | ICD-10-CM

## 2022-01-16 DIAGNOSIS — Z131 Encounter for screening for diabetes mellitus: Secondary | ICD-10-CM | POA: Diagnosis not present

## 2022-01-16 DIAGNOSIS — Z23 Encounter for immunization: Secondary | ICD-10-CM | POA: Diagnosis not present

## 2022-01-16 DIAGNOSIS — E010 Iodine-deficiency related diffuse (endemic) goiter: Secondary | ICD-10-CM | POA: Diagnosis not present

## 2022-01-16 DIAGNOSIS — E669 Obesity, unspecified: Secondary | ICD-10-CM | POA: Insufficient documentation

## 2022-01-16 DIAGNOSIS — R5383 Other fatigue: Secondary | ICD-10-CM | POA: Diagnosis not present

## 2022-01-16 LAB — HEMOGLOBIN A1C: Hgb A1c MFr Bld: 5.4 % (ref 4.6–6.5)

## 2022-01-16 LAB — VITAMIN D 25 HYDROXY (VIT D DEFICIENCY, FRACTURES): VITD: 38.08 ng/mL (ref 30.00–100.00)

## 2022-01-16 LAB — TSH: TSH: 1.36 u[IU]/mL (ref 0.35–5.50)

## 2022-01-16 MED ORDER — LEVOTHYROXINE SODIUM 25 MCG PO TABS: 25.0000 ug | ORAL_TABLET | Freq: Every day | ORAL | 3 refills | Status: DC

## 2022-01-16 MED ORDER — BUPROPION HCL ER (XL) 150 MG PO TB24: 150.0000 mg | ORAL_TABLET | Freq: Every day | ORAL | 2 refills | Status: DC

## 2022-01-16 NOTE — Progress Notes (Signed)
Established Patient Office Visit  Subjective   Patient ID: Erin Cole, female    DOB: [birth year]  Age: 56 y.o. MRN: [id number]  Chief Complaint  Patient presents with   Follow-up    6 month follow on TSH and medications. Patient not fasting.     HPI for follow-up of thyroid megaly and fatigue.  Does not believe that the low-dose levothyroxine has helped a great deal.  However realizes that her thyroid has decreased in size.  She is exercising by walking.  Her husband is battling bladder cancer.  He will undergo a cystectomy and prostatectomy soon.  She continues to have difficulty losing weight and is frustrated.    Review of Systems  Constitutional: Negative.   HENT: Negative.    Eyes:  Negative for blurred vision, discharge and redness.  Respiratory: Negative.    Cardiovascular: Negative.  Negative for palpitations.  Gastrointestinal:  Negative for abdominal pain.  Genitourinary: Negative.   Musculoskeletal: Negative.  Negative for myalgias.  Skin:  Negative for rash.  Neurological:  Negative for tingling, loss of consciousness and weakness.  Endo/Heme/Allergies:  Negative for polydipsia.      Objective:     BP 122/70 (BP Location: Right Arm, Patient Position: Sitting, Cuff Size: Large)   Pulse 98   Temp (!) 96.9 F (36.1 C) (Temporal)   Ht 5\' 7"  (1.702 m)   Wt 205 lb 6.4 oz (93.2 kg)   LMP 08/17/2012 (Approximate)   SpO2 96%   BMI 32.17 kg/m  Wt Readings from Last 3 Encounters:  01/16/22 205 lb 6.4 oz (93.2 kg)  11/11/21 205 lb 6.4 oz (93.2 kg)  07/12/21 197 lb 9.6 oz (89.6 kg)      Physical Exam Constitutional:      General: She is not in acute distress.    Appearance: Normal appearance. She is not ill-appearing, toxic-appearing or diaphoretic.  HENT:     Head: Normocephalic and atraumatic.     Right Ear: External ear normal.     Left Ear: External ear normal.     Mouth/Throat:     Mouth: Mucous membranes are moist.     Pharynx: Oropharynx is  clear. No oropharyngeal exudate or posterior oropharyngeal erythema.  Eyes:     General: No scleral icterus.       Right eye: No discharge.        Left eye: No discharge.     Extraocular Movements: Extraocular movements intact.     Conjunctiva/sclera: Conjunctivae normal.     Pupils: Pupils are equal, round, and reactive to light.  Neck:     Thyroid: No thyroid mass, thyromegaly or thyroid tenderness.  Cardiovascular:     Rate and Rhythm: Normal rate and regular rhythm.  Pulmonary:     Effort: Pulmonary effort is normal. No respiratory distress.     Breath sounds: Normal breath sounds.  Abdominal:     General: Bowel sounds are normal.     Tenderness: There is no abdominal tenderness. There is no guarding.  Musculoskeletal:     Cervical back: No rigidity or tenderness.  Skin:    General: Skin is warm and dry.  Neurological:     Mental Status: She is alert and oriented to person, place, and time.  Psychiatric:        Mood and Affect: Mood normal.        Behavior: Behavior normal.      No results found for any visits on 01/16/22.    The  10-year ASCVD risk score (Arnett DK, et al., 2019) is: 2.5%    Assessment & Plan:   Problem List Items Addressed This Visit       Endocrine   Thyromegaly - Primary   Relevant Medications   levothyroxine (SYNTHROID) 25 MCG tablet   Other Relevant Orders   TSH     Other   Screening for diabetes mellitus   Relevant Orders   Hemoglobin A1c   Vitamin D deficiency   Relevant Orders   VITAMIN D 25 Hydroxy (Vit-D Deficiency, Fractures)   Other fatigue   Relevant Medications   buPROPion (WELLBUTRIN XL) 150 MG 24 hr tablet   levothyroxine (SYNTHROID) 25 MCG tablet   Obesity (BMI 30.0-34.9)   Relevant Orders   Amb ref to Medical Nutrition Therapy-MNT   Need for shingles vaccine   Relevant Orders   Varicella-zoster vaccine IM    Return in about 8 weeks (around 03/13/2022).  We will continue levothyroxine at low-dose.  Thyroid exam  normal today.  Rechecking vitamin D level.  We will add Wellbutrin XL 150 that may increase energy levels and help with weight loss.  Warned about insomnia.  She will take it in the morning.  Continue exercise by walking.  First Shingrix vaccine today.  Libby Maw, MD

## 2022-03-27 ENCOUNTER — Encounter: Payer: Self-pay | Admitting: Nurse Practitioner

## 2022-03-27 ENCOUNTER — Ambulatory Visit (INDEPENDENT_AMBULATORY_CARE_PROVIDER_SITE_OTHER): Payer: BC Managed Care – PPO | Admitting: Nurse Practitioner

## 2022-03-27 VITALS — BP 125/73 | HR 72 | Temp 98.3°F | Ht 67.0 in | Wt 200.0 lb

## 2022-03-27 DIAGNOSIS — Z6831 Body mass index (BMI) 31.0-31.9, adult: Secondary | ICD-10-CM | POA: Diagnosis not present

## 2022-03-27 DIAGNOSIS — E669 Obesity, unspecified: Secondary | ICD-10-CM | POA: Diagnosis not present

## 2022-03-27 DIAGNOSIS — E559 Vitamin D deficiency, unspecified: Secondary | ICD-10-CM

## 2022-03-27 DIAGNOSIS — Z0289 Encounter for other administrative examinations: Secondary | ICD-10-CM
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
## 2022-04-28 ENCOUNTER — Encounter: Payer: Self-pay | Admitting: Bariatrics

## 2022-04-28 ENCOUNTER — Ambulatory Visit (INDEPENDENT_AMBULATORY_CARE_PROVIDER_SITE_OTHER): Payer: BC Managed Care – PPO | Admitting: Bariatrics

## 2022-04-28 VITALS — BP 116/71 | HR 72 | Temp 97.6°F | Ht 67.0 in | Wt 200.0 lb

## 2022-04-28 DIAGNOSIS — E559 Vitamin D deficiency, unspecified: Secondary | ICD-10-CM | POA: Diagnosis not present

## 2022-04-28 DIAGNOSIS — E010 Iodine-deficiency related diffuse (endemic) goiter: Secondary | ICD-10-CM

## 2022-04-28 DIAGNOSIS — R5383 Other fatigue: Secondary | ICD-10-CM

## 2022-04-28 DIAGNOSIS — R7309 Other abnormal glucose: Secondary | ICD-10-CM

## 2022-04-28 DIAGNOSIS — E669 Obesity, unspecified: Secondary | ICD-10-CM

## 2022-04-28 DIAGNOSIS — K582 Mixed irritable bowel syndrome: Secondary | ICD-10-CM | POA: Diagnosis not present

## 2022-04-28 DIAGNOSIS — Z6831 Body mass index (BMI) 31.0-31.9, adult: Secondary | ICD-10-CM

## 2022-04-28 DIAGNOSIS — Z1331 Encounter for screening for depression: Secondary | ICD-10-CM

## 2022-04-28 DIAGNOSIS — Z0000 Encounter for general adult medical examination without abnormal findings: Secondary | ICD-10-CM

## 2022-04-28 DIAGNOSIS — R0602 Shortness of breath: Secondary | ICD-10-CM | POA: Diagnosis not present

## 2022-04-29 ENCOUNTER — Telehealth: Payer: Self-pay

## 2022-04-29 ENCOUNTER — Encounter (INDEPENDENT_AMBULATORY_CARE_PROVIDER_SITE_OTHER): Payer: Self-pay | Admitting: Bariatrics

## 2022-04-29 ENCOUNTER — Other Ambulatory Visit: Payer: Self-pay | Admitting: Family Medicine

## 2022-04-29 DIAGNOSIS — R5383 Other fatigue: Secondary | ICD-10-CM

## 2022-04-29 LAB — COMPREHENSIVE METABOLIC PANEL
ALT: 12 IU/L (ref 0–32)
AST: 17 IU/L (ref 0–40)
Albumin/Globulin Ratio: 1.8 (ref 1.2–2.2)
Albumin: 4.5 g/dL (ref 3.8–4.9)
Alkaline Phosphatase: 66 IU/L (ref 44–121)
BUN/Creatinine Ratio: 13 (ref 9–23)
BUN: 12 mg/dL (ref 6–24)
Bilirubin Total: 0.3 mg/dL (ref 0.0–1.2)
CO2: 23 mmol/L (ref 20–29)
Calcium: 10.3 mg/dL — ABNORMAL HIGH (ref 8.7–10.2)
Chloride: 105 mmol/L (ref 96–106)
Creatinine, Ser: 0.91 mg/dL (ref 0.57–1.00)
Globulin, Total: 2.5 g/dL (ref 1.5–4.5)
Glucose: 95 mg/dL (ref 70–99)
Potassium: 4.8 mmol/L (ref 3.5–5.2)
Sodium: 141 mmol/L (ref 134–144)
Total Protein: 7 g/dL (ref 6.0–8.5)
eGFR: 75 mL/min/{1.73_m2} (ref >59)

## 2022-04-29 LAB — LIPID PANEL WITH LDL/HDL RATIO
Cholesterol, Total: 237 mg/dL — ABNORMAL HIGH (ref 100–199)
HDL: 43 mg/dL (ref >39)
LDL Chol Calc (NIH): 150 mg/dL — ABNORMAL HIGH (ref 0–99)
LDL/HDL Ratio: 3.5 ratio — ABNORMAL HIGH (ref 0.0–3.2)
Triglycerides: 242 mg/dL — ABNORMAL HIGH (ref 0–149)
VLDL Cholesterol Cal: 44 mg/dL — ABNORMAL HIGH (ref 5–40)

## 2022-04-29 LAB — TSH+T4F+T3FREE
Free T4: 1.02 ng/dL (ref 0.82–1.77)
T3, Free: 2.7 pg/mL (ref 2.0–4.4)
TSH: 1.98 u[IU]/mL (ref 0.450–4.500)

## 2022-04-29 LAB — VITAMIN D 25 HYDROXY (VIT D DEFICIENCY, FRACTURES): Vit D, 25-Hydroxy: 33.3 ng/mL (ref 30.0–100.0)

## 2022-04-29 LAB — INSULIN, RANDOM: INSULIN: 17.4 u[IU]/mL (ref 2.6–24.9)

## 2022-04-29 LAB — HEMOGLOBIN A1C
Est. average glucose Bld gHb Est-mCnc: 108 mg/dL
Hgb A1c MFr Bld: 5.4 % (ref 4.8–5.6)

## 2022-04-29 NOTE — Telephone Encounter (Signed)
RF request received for Wellbutrin '150mg'$ . Called pt to scheduled OV for medication RF. Dr, Ethelene Hal wanted pt to f/u back in January.

## 2022-05-07 ENCOUNTER — Other Ambulatory Visit: Payer: Self-pay

## 2022-05-07 DIAGNOSIS — R5383 Other fatigue: Secondary | ICD-10-CM

## 2022-05-07 NOTE — Telephone Encounter (Signed)
Refill request for pending Rx last refill and OV 01/16/2022. Please advise

## 2022-05-07 NOTE — Progress Notes (Signed)
Chief Complaint:   OBESITY Erin Cole (MR# [id number]) is a 57 y.o. female who presents for evaluation and treatment of obesity and related comorbidities. Current BMI is Body mass index is 31.32 kg/m. Erin Cole has been struggling with her weight for many years and has been unsuccessful in either losing weight, maintaining weight loss, or reaching her healthy weight goal.  Erin Cole had an information session with Erin Pacific, NP on 03/27/2022.  She states that she is not good at it.  She eats for comfort.   [name] is currently in the action stage of change and ready to dedicate time achieving and maintaining a healthier weight. [name] is interested in becoming our patient and working on intensive lifestyle modifications including (but not limited to) diet and exercise for weight loss.  Erin Cole's habits were reviewed today and are as follows: Her family eats meals together, she thinks her family will eat healthier with her, her desired weight loss is 50 lbs, she started gaining weight in her 40's, her heaviest weight ever was 200 pounds, she has significant food cravings issues, she snacks frequently in the evenings, she skips meals frequently, she is frequently drinking liquids with calories, she frequently makes poor food choices, she frequently eats larger portions than normal, and she struggles with emotional eating.  Depression Screen Erin Cole's Food and Mood (modified PHQ-9) score was 7.  Subjective:   1. Other fatigue Erin Cole admits to daytime somnolence and admits to waking up still tired. Patient has a history of symptoms of daytime fatigue and morning fatigue. Erin Cole generally gets 6 or 7 hours of sleep per night, and states that she has nightime awakenings and generally restful sleep. Snoring is present. Apneic episodes are not present. Epworth Sleepiness Score is 10.   2. SOB (shortness of breath) on exertion Erin Cole notes increasing shortness of breath with exercising and seems to  be worsening over time with weight gain. She notes getting out of breath sooner with activity than she used to. This has not gotten worse recently. Erin Cole denies shortness of breath at rest or orthopnea.  3. Vitamin D deficiency Patient is taking vitamin D 4000 IU daily.  4. Thyromegaly Patient is taking Synthroid.  Ultrasound 2019?  5. Health care maintenance Obesity.  6. Irritable bowel syndrome with both constipation and diarrhea Triggers:  stress and traveling.   7. Elevated glucose Patient is not taking any medication.  Assessment/Plan:   1. Other fatigue [name] does feel that her weight is causing her energy to be lower than it should be. Fatigue may be related to obesity, depression or many other causes. Labs will be ordered, and in the meanwhile, [name] will focus on self care including making healthy food choices, increasing physical activity and focusing on stress reduction.  Discussed implication for her diet and exercise for RMR.  - EKG 12-Lead - Lipid Panel With LDL/HDL Ratio - TSH+T4F+T3Free  2. SOB (shortness of breath) on exertion [name] does feel that she gets out of breath more easily that she used to when she exercises. [name]'s shortness of breath appears to be obesity related and exercise induced. She has agreed to work on weight loss and gradually increase exercise to treat her exercise induced shortness of breath. Will continue to monitor closely.  - TSH+T4F+T3Free  3. Vitamin D deficiency Continue vitamin D.  Check labs today.  - VITAMIN D 25 Hydroxy (Vit-D Deficiency, Fractures)  4. Thyromegaly Continue Synthroid.  Check labs today.  - Lipid Panel With LDL/HDL  Ratio - Comprehensive metabolic panel  5. Health care maintenance Check EKG, labs, IC.  6. Irritable bowel syndrome with both constipation and diarrhea Avoid triggers.   7. Elevated glucose Check labs today.  Keep all carbohydrates, sweets and starches out of the house. - Hemoglobin A1c -  Insulin, random  8. Depression screening Erin Cole had a positive depression screening. Depression is commonly associated with obesity and often results in emotional eating behaviors. We will monitor this closely and work on CBT to help improve the non-hunger eating patterns. Referral to Psychology may be required if no improvement is seen as she continues in our clinic.  9. Generalized obesity Check labs today.   - Lipid Panel With LDL/HDL Ratio  10. BMI 31.0-31.9,adult Meal planning. Reviewed labs from 07/12/2021. Check labs today.   - Lipid Panel With LDL/HDL Ratio  Erin Cole is currently in the action stage of change and her goal is to continue with weight loss efforts. I recommend [name] begin the structured treatment plan as follows:  She has agreed to the Category 2 Plan.  Exercise goals: She is walking at lunch for 2 miles most days.  Behavioral modification strategies: increasing lean protein intake, decreasing simple carbohydrates, increasing vegetables, increasing water intake, decreasing eating out, no skipping meals, meal planning and cooking strategies, keeping healthy foods in the home, and planning for success.  She was informed of the importance of frequent follow-up visits to maximize her success with intensive lifestyle modifications for her multiple health conditions. She was informed we would discuss her lab results at her next visit unless there is a critical issue that needs to be addressed sooner. Erin Cole agreed to keep her next visit at the agreed upon time to discuss these results.  Objective:   Blood pressure 116/71, pulse 72, temperature 97.6 F (36.4 C), height 5\' 7"  (1.702 m), weight 200 lb (90.7 kg), last menstrual period 08/17/2012, SpO2 98 %. Body mass index is 31.32 kg/m.  EKG: Normal sinus rhythm, rate 73 bpm.  Indirect Calorimeter completed today shows a VO2 of 235 and a REE of 1627.  Her calculated basal metabolic rate is [id number] thus her basal metabolic rate  is worse than expected.  General: Cooperative, alert, well developed, in no acute distress. HEENT: Conjunctivae and lids unremarkable. Cardiovascular: Regular rhythm.  Lungs: Normal work of breathing. Neurologic: No focal deficits.   Lab Results  Component Value Date   CREATININE 0.91 04/28/2022   BUN 12 04/28/2022   NA 141 04/28/2022   K 4.8 04/28/2022   CL 105 04/28/2022   CO2 23 04/28/2022   Lab Results  Component Value Date   ALT 12 04/28/2022   AST 17 04/28/2022   ALKPHOS 66 04/28/2022   BILITOT 0.3 04/28/2022   Lab Results  Component Value Date   HGBA1C 5.4 04/28/2022   HGBA1C 5.4 01/16/2022   Lab Results  Component Value Date   INSULIN 17.4 04/28/2022   Lab Results  Component Value Date   TSH 1.980 04/28/2022   Lab Results  Component Value Date   CHOL 237 (H) 04/28/2022   HDL 43 04/28/2022   LDLCALC 150 (H) 04/28/2022   LDLDIRECT 130.7 11/11/2011   TRIG 242 (H) 04/28/2022   CHOLHDL 5 07/12/2021   Lab Results  Component Value Date   WBC 6.0 07/12/2021   HGB 14.0 07/12/2021   HCT 42.1 07/12/2021   MCV 90.9 07/12/2021   PLT 285.0 07/12/2021   No results found for: "IRON", "TIBC", "FERRITIN"  Attestation Statements:  Reviewed by clinician on day of visit: allergies, medications, problem list, medical history, surgical history, family history, social history, and previous encounter notes.  Delfina Redwood, am acting as Location manager for [employer], DO.  I have reviewed the above documentation for accuracy and completeness, and I agree with the above. Jearld Lesch, DO

## 2022-05-12 ENCOUNTER — Encounter: Payer: Self-pay | Admitting: Bariatrics

## 2022-05-12 ENCOUNTER — Ambulatory Visit (INDEPENDENT_AMBULATORY_CARE_PROVIDER_SITE_OTHER): Payer: BC Managed Care – PPO | Admitting: Bariatrics

## 2022-05-12 VITALS — BP 111/72 | HR 77 | Temp 98.0°F | Ht 67.0 in | Wt 201.0 lb

## 2022-05-12 DIAGNOSIS — E7800 Pure hypercholesterolemia, unspecified: Secondary | ICD-10-CM | POA: Diagnosis not present

## 2022-05-12 DIAGNOSIS — Z6831 Body mass index (BMI) 31.0-31.9, adult: Secondary | ICD-10-CM

## 2022-05-12 DIAGNOSIS — E88819 Insulin resistance, unspecified: Secondary | ICD-10-CM | POA: Diagnosis not present

## 2022-05-12 DIAGNOSIS — E669 Obesity, unspecified: Secondary | ICD-10-CM | POA: Diagnosis not present

## 2022-05-12 MED ORDER — BUPROPION HCL ER (XL) 150 MG PO TB24: 150.0000 mg | ORAL_TABLET | Freq: Every day | ORAL | 2 refills | Status: DC

## 2022-05-12 NOTE — Addendum Note (Signed)
Addended by: Jon Billings on: 05/12/2022 04:34 PM   Modules accepted: Orders

## 2022-05-13 NOTE — Progress Notes (Unsigned)
Chief Complaint:   OBESITY Erin Cole is here to discuss her progress with her obesity treatment plan along with follow-up of her obesity related diagnoses. Erin Cole is on the Category 2 Plan and states she is following her eating plan approximately 100% of the time. Erin Cole states she is walking 1-2 miles daily.    Today's visit was #: 2 Starting weight: 200 lbs Starting date: 04/28/2022 Today's weight: 201 lbs Today's date: 05/12/2022 Total lbs lost to date: 0 Total lbs lost since last in-office visit: 0  Interim History: Erin Cole is up 1 pound since her initial visit.  She has increased her protein to 65-85 g.  She stopped the Wellbutrin, and she has not had EtOH for 2 weeks.  She notes decreased walking due to plantar fasciitis.  Subjective:   1. Insulin resistance Erin Cole's recent insulin level was 17.4 and her A1c 5.4.  2. Elevated cholesterol Erin Cole's recent triglycerides were 212 and LDL 150.  Assessment/Plan:   1. Insulin resistance Goal for insulin is below 10 ideal 5.  Erin Cole will begin her low carbohydrate plan and decrease carbohydrates overall.  2. Elevated cholesterol Erin Cole will continue with the meal plan and exercise.  3. Generalized obesity  4. BMI 31.0-31.9,adult Erin Cole is currently in the action stage of change. As such, her goal is to continue with weight loss efforts. She has agreed to keeping a food journal and adhering to recommended goals of 1200-1300 calories and 80-90 grams of protein and following a lower carbohydrate, vegetable and lean protein rich diet plan.   Meal planning was discussed.  Review labs with the patient from 04/28/2022, CMP, lipids, vitamin D, A1c, insulin, and thyroid panel.  Exercise goals: As is.   Behavioral modification strategies: increasing lean protein intake, decreasing simple carbohydrates, increasing vegetables, increasing water intake, decreasing eating out, no skipping meals, meal planning and cooking strategies, and keeping  healthy foods in the home.  Erin Cole has agreed to follow-up with our clinic in 2 weeks. She was informed of the importance of frequent follow-up visits to maximize her success with intensive lifestyle modifications for her multiple health conditions.   Objective:   Blood pressure 111/72, pulse 77, temperature 98 F (36.7 C), height 5\' 7"  (1.702 m), weight 201 lb (91.2 kg), last menstrual period 08/17/2012, SpO2 96 %. Body mass index is 31.48 kg/m.  General: Cooperative, alert, well developed, in no acute distress. HEENT: Conjunctivae and lids unremarkable. Cardiovascular: Regular rhythm.  Lungs: Normal work of breathing. Neurologic: No focal deficits.   Lab Results  Component Value Date   CREATININE 0.91 04/28/2022   BUN 12 04/28/2022   NA 141 04/28/2022   K 4.8 04/28/2022   CL 105 04/28/2022   CO2 23 04/28/2022   Lab Results  Component Value Date   ALT 12 04/28/2022   AST 17 04/28/2022   ALKPHOS 66 04/28/2022   BILITOT 0.3 04/28/2022   Lab Results  Component Value Date   HGBA1C 5.4 04/28/2022   HGBA1C 5.4 01/16/2022   Lab Results  Component Value Date   INSULIN 17.4 04/28/2022   Lab Results  Component Value Date   TSH 1.980 04/28/2022   Lab Results  Component Value Date   CHOL 237 (H) 04/28/2022   HDL 43 04/28/2022   LDLCALC 150 (H) 04/28/2022   LDLDIRECT 130.7 11/11/2011   TRIG 242 (H) 04/28/2022   CHOLHDL 5 07/12/2021   Lab Results  Component Value Date   VD25OH 33.3 04/28/2022   VD25OH 38.08 01/16/2022  VD25OH 25.98 (L) 07/12/2021   Lab Results  Component Value Date   WBC 6.0 07/12/2021   HGB 14.0 07/12/2021   HCT 42.1 07/12/2021   MCV 90.9 07/12/2021   PLT 285.0 07/12/2021   No results found for: "IRON", "TIBC", "FERRITIN"  Attestation Statements:   Reviewed by clinician on day of visit: allergies, medications, problem list, medical history, surgical history, family history, social history, and previous encounter notes.   Wilhemena Durie, am acting as Location manager for [employer], DO.  I have reviewed the above documentation for accuracy and completeness, and I agree with the above. Jearld Lesch, DO

## 2022-05-14 ENCOUNTER — Encounter: Payer: Self-pay | Admitting: Bariatrics

## 2022-08-25 ENCOUNTER — Encounter: Payer: Self-pay | Admitting: Family Medicine

## 2022-11-20 ENCOUNTER — Encounter (HOSPITAL_BASED_OUTPATIENT_CLINIC_OR_DEPARTMENT_OTHER): Payer: Self-pay | Admitting: Obstetrics & Gynecology

## 2022-11-20 ENCOUNTER — Ambulatory Visit (HOSPITAL_BASED_OUTPATIENT_CLINIC_OR_DEPARTMENT_OTHER): Payer: BC Managed Care – PPO | Admitting: Obstetrics & Gynecology

## 2022-11-20 VITALS — BP 122/56 | HR 81 | Ht 67.0 in | Wt 190.2 lb

## 2022-11-20 DIAGNOSIS — Z01419 Encounter for gynecological examination (general) (routine) without abnormal findings: Secondary | ICD-10-CM | POA: Diagnosis not present

## 2022-11-20 DIAGNOSIS — Z23 Encounter for immunization: Secondary | ICD-10-CM

## 2022-11-20 DIAGNOSIS — Z79890 Hormone replacement therapy (postmenopausal): Secondary | ICD-10-CM | POA: Diagnosis not present

## 2022-11-20 MED ORDER — MEDROXYPROGESTERONE ACETATE 5 MG PO TABS: ORAL_TABLET | ORAL | 3 refills | Status: DC

## 2022-11-20 MED ORDER — ESTRADIOL 1 MG PO TABS: ORAL_TABLET | ORAL | 3 refills | Status: DC
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
## 2023-02-23 DIAGNOSIS — R002 Palpitations: Secondary | ICD-10-CM | POA: Diagnosis not present

## 2023-02-23 DIAGNOSIS — Z7689 Persons encountering health services in other specified circumstances: Secondary | ICD-10-CM | POA: Diagnosis not present

## 2023-03-27 DIAGNOSIS — J029 Acute pharyngitis, unspecified: Secondary | ICD-10-CM | POA: Diagnosis not present

## 2023-03-27 DIAGNOSIS — K009 Disorder of tooth development, unspecified: Secondary | ICD-10-CM | POA: Diagnosis not present

## 2023-04-01 DIAGNOSIS — R002 Palpitations: Secondary | ICD-10-CM | POA: Diagnosis not present

## 2023-04-02 IMAGING — MG MM DIGITAL SCREENING BILAT W/ TOMO AND CAD
8 series · 8 of 24 positions shown · non-contrast
Comparison: Previous exam(s).

ACR Breast Density Category a: The breast tissue is almost entirely
fatty.

CLINICAL DATA: Screening.

EXAM:
DIGITAL SCREENING BILATERAL MAMMOGRAM WITH TOMOSYNTHESIS AND CAD
TECHNIQUE: Bilateral screening digital craniocaudal and mediolateral oblique
mammograms were obtained. Bilateral screening digital breast
tomosynthesis was performed. The images were evaluated with
computer-aided detection.

[L MLO synth-2D]
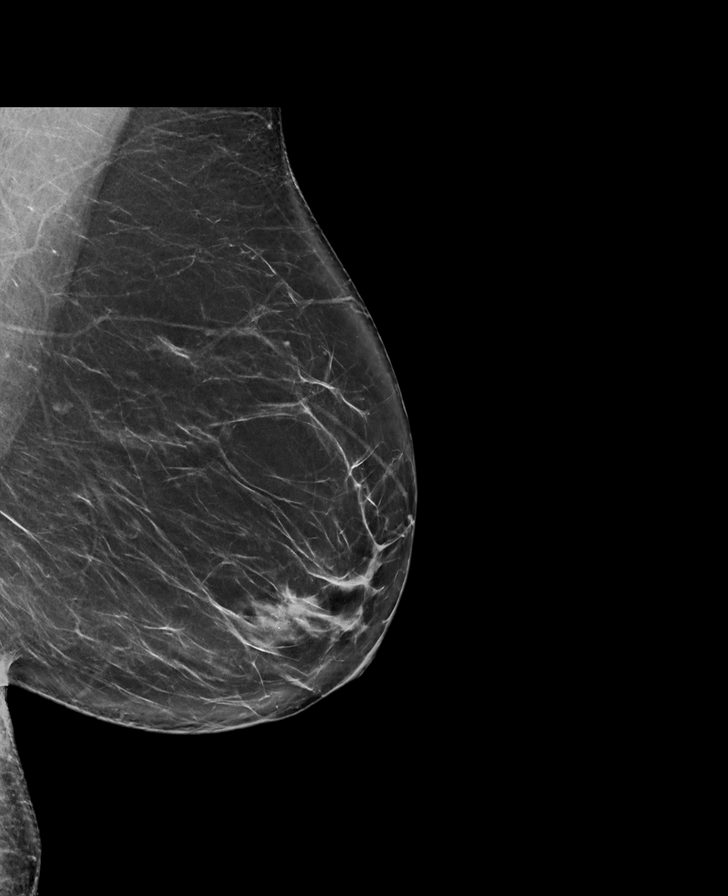

[R CC synth-2D]
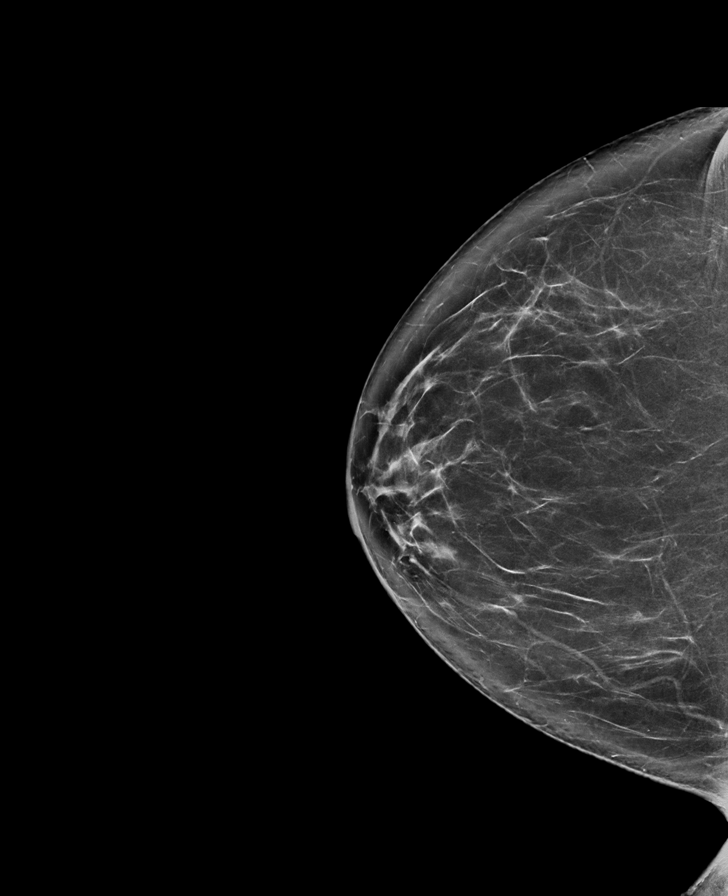

[R MLO synth-2D]
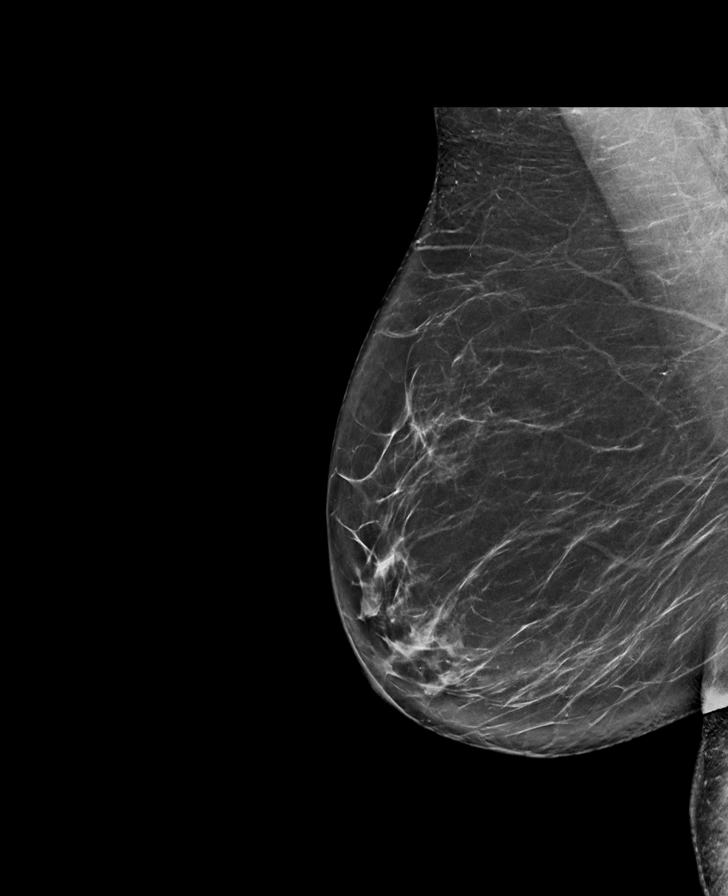

[L CC synth-2D]
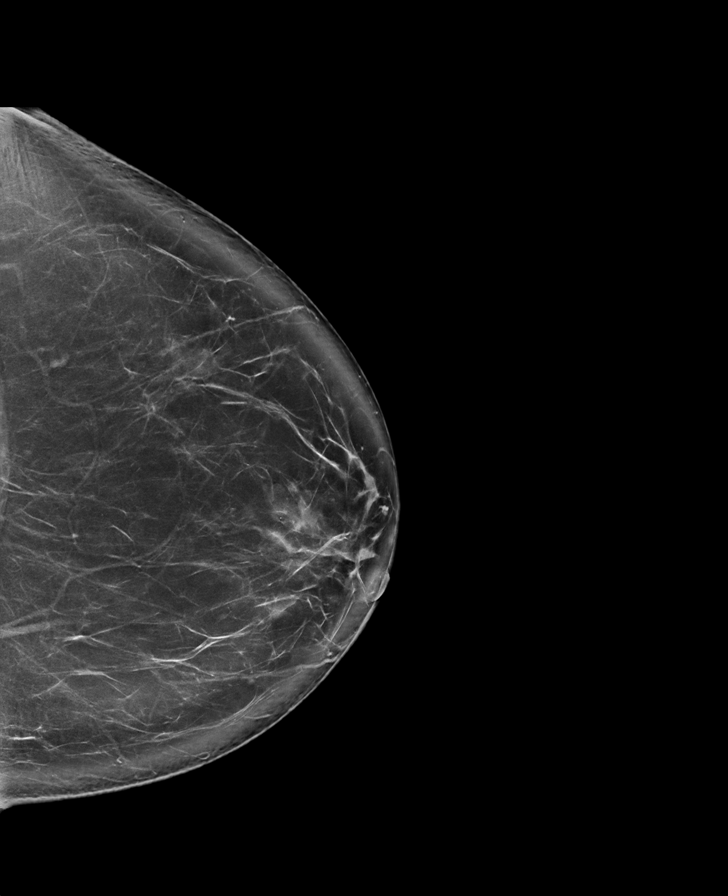

[L CC tomo · tomo slice 47/93.0]
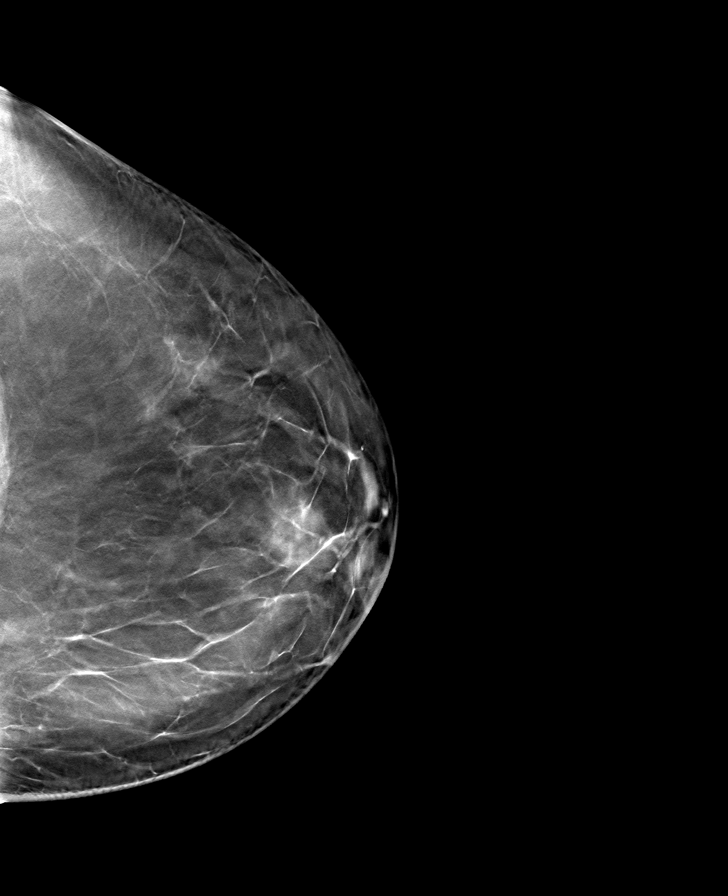

[L MLO tomo · tomo slice 45/90.0]
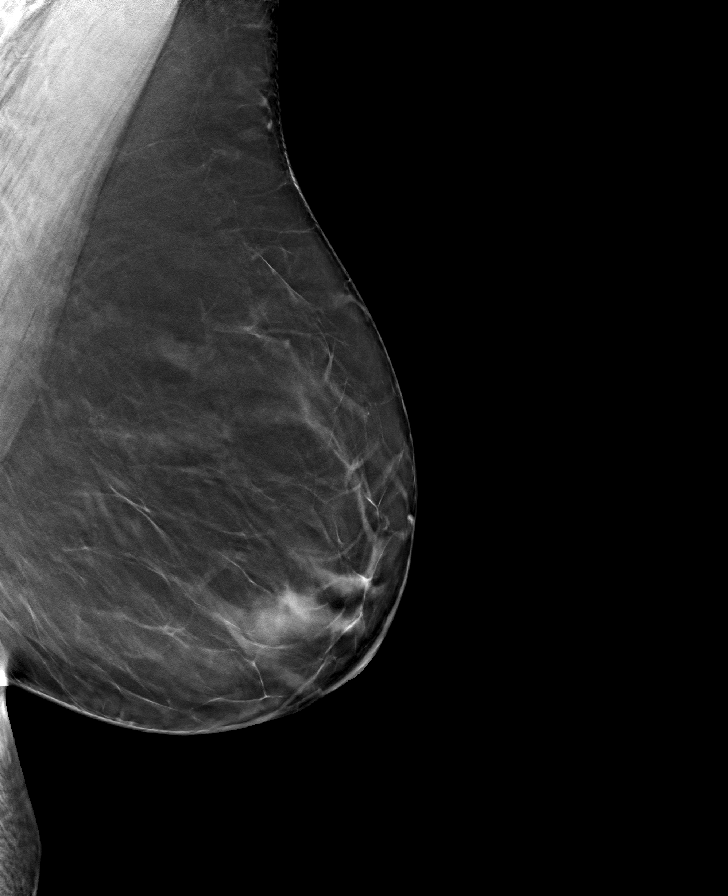

[R CC tomo · tomo slice 42/83.0]
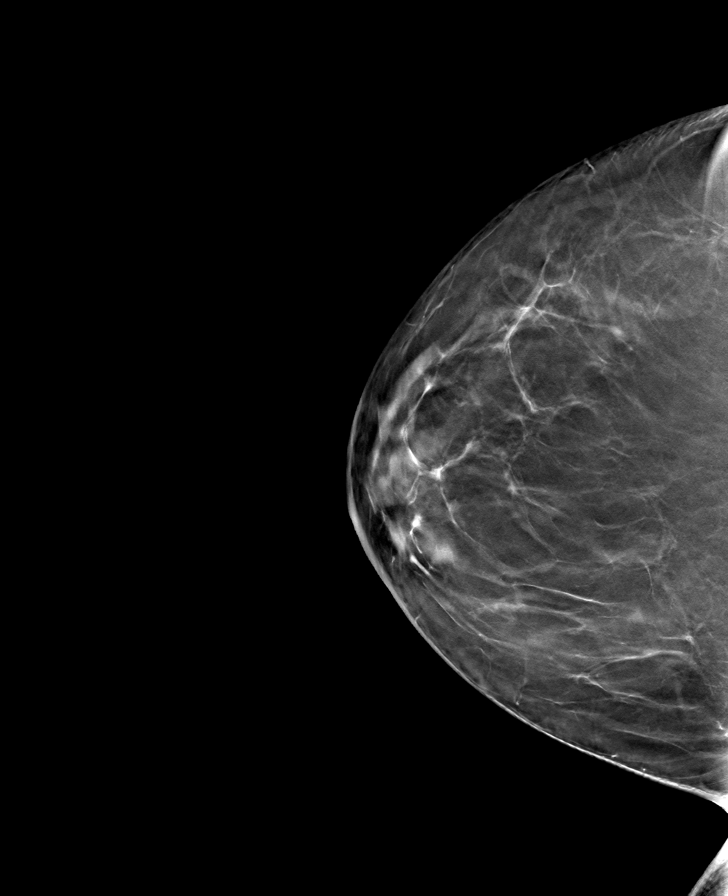

[R MLO tomo · tomo slice 46/91.0]
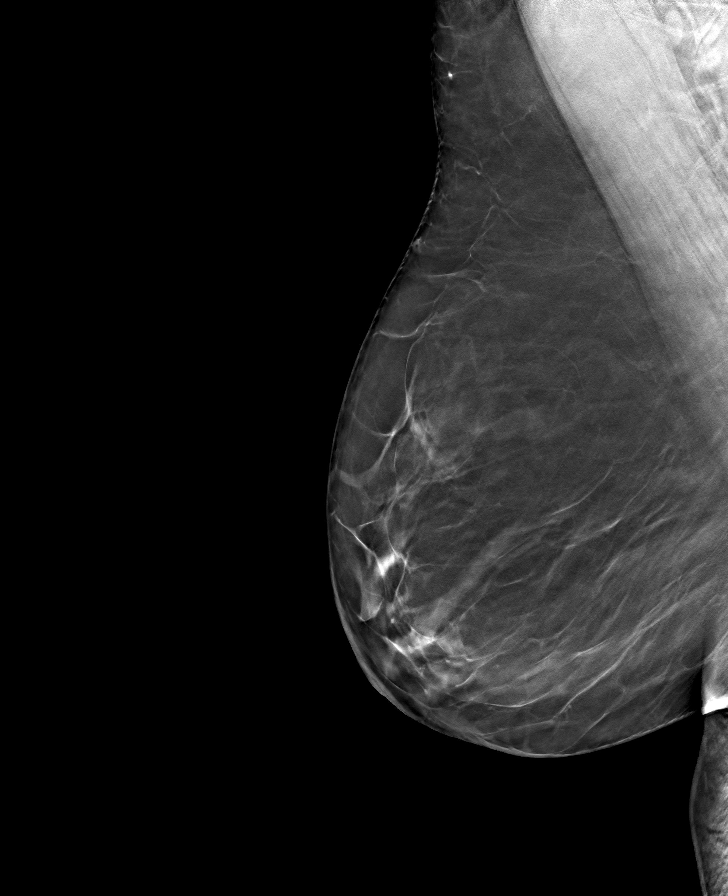

[8 of 24 positions shown; findings below may reference images not displayed]

FINDINGS: There are no findings suspicious for malignancy.
IMPRESSION: No mammographic evidence of malignancy. A result letter of this
screening mammogram will be mailed directly to the patient.

RECOMMENDATION:
Screening mammogram in one year. (Code:0E-3-N98)

BI-RADS CATEGORY  1: Negative.

## 2023-04-13 DIAGNOSIS — R002 Palpitations: Secondary | ICD-10-CM | POA: Diagnosis not present

## 2023-07-28 DIAGNOSIS — Z0000 Encounter for general adult medical examination without abnormal findings: Secondary | ICD-10-CM | POA: Diagnosis not present

## 2023-07-28 DIAGNOSIS — Z23 Encounter for immunization: Secondary | ICD-10-CM | POA: Diagnosis not present

## 2023-07-28 DIAGNOSIS — E559 Vitamin D deficiency, unspecified: Secondary | ICD-10-CM | POA: Diagnosis not present

## 2023-07-28 DIAGNOSIS — Z1159 Encounter for screening for other viral diseases: Secondary | ICD-10-CM | POA: Diagnosis not present

## 2023-09-11 ENCOUNTER — Other Ambulatory Visit (HOSPITAL_BASED_OUTPATIENT_CLINIC_OR_DEPARTMENT_OTHER): Payer: Self-pay | Admitting: Obstetrics & Gynecology

## 2023-09-11 DIAGNOSIS — F4320 Adjustment disorder, unspecified: Secondary | ICD-10-CM

## 2023-09-11 MED ORDER — ALPRAZOLAM 0.5 MG PO TABS: ORAL_TABLET | ORAL | 0 refills | Status: DC

## 2023-11-23 ENCOUNTER — Encounter (HOSPITAL_BASED_OUTPATIENT_CLINIC_OR_DEPARTMENT_OTHER): Payer: Self-pay | Admitting: Obstetrics & Gynecology

## 2023-11-23 ENCOUNTER — Ambulatory Visit (HOSPITAL_BASED_OUTPATIENT_CLINIC_OR_DEPARTMENT_OTHER): Payer: BC Managed Care – PPO | Admitting: Obstetrics & Gynecology

## 2023-11-23 VITALS — BP 119/60 | HR 78 | Ht 67.0 in | Wt 178.0 lb

## 2023-11-23 DIAGNOSIS — Z01419 Encounter for gynecological examination (general) (routine) without abnormal findings: Secondary | ICD-10-CM | POA: Diagnosis not present

## 2023-11-23 DIAGNOSIS — Z9889 Other specified postprocedural states: Secondary | ICD-10-CM

## 2023-11-23 DIAGNOSIS — Z23 Encounter for immunization: Secondary | ICD-10-CM

## 2023-11-23 DIAGNOSIS — Z8619 Personal history of other infectious and parasitic diseases: Secondary | ICD-10-CM

## 2023-11-23 DIAGNOSIS — Z79890 Hormone replacement therapy (postmenopausal): Secondary | ICD-10-CM | POA: Diagnosis not present

## 2023-11-23 DIAGNOSIS — F4320 Adjustment disorder, unspecified: Secondary | ICD-10-CM

## 2023-11-23 MED ORDER — ALPRAZOLAM 0.5 MG PO TABS: ORAL_TABLET | ORAL | 0 refills | Status: AC

## 2023-11-23 MED ORDER — ESTRADIOL 1 MG PO TABS: ORAL_TABLET | ORAL | 3 refills | Status: AC

## 2023-11-23 MED ORDER — PROGESTERONE MICRONIZED 100 MG PO CAPS: 100.0000 mg | ORAL_CAPSULE | Freq: Every day | ORAL | 3 refills | Status: AC

## 2023-11-23 NOTE — Progress Notes (Signed)
 ANNUAL EXAM Patient name: Erin Cole MRN [id number]  Date of birth: 02-02-1966 Chief Complaint:   Gynecologic Exam (Pt reports no concerns or complaints today. )  History of Present Illness:   Erin Cole is a 58 y.o. G18P0011 Caucasian female being seen today for a routine annual exam.   Has been using semiglutide this past year.  Off right now.  Has some nausea with it.    Husband had a harder year.  He had to have nephrostomy tubes placed this year.  One got infected and he had to be hospitalized again.  Then later had sepsis and was in the IUD for a week.  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  Had clean margins but will start Immunotherapy in October.    Discussed switching progesterone to micronized progesterone.    Patient's last menstrual period was 08/17/2012 (approximate).   Last pap 10/10/2020. Results were: NILM w/ HRHPV negative. H/O abnormal pap: no Last mammogram: 07/23/2021. Results were: normal. Family h/o breast cancer: yes mother. Last colonoscopy: Patient reports that she has not had a colonoscopy done. She has done Cologuard screening done and everything came back normal. Family h/o colorectal cancer: no     11/23/2023    8:39 AM 11/20/2022    8:42 AM 01/16/2022    8:01 AM 11/11/2021    2:53 PM 07/12/2021    9:34 AM  Depression screen PHQ 2/9  Decreased Interest 0 0 0 0 0  Down, Depressed, Hopeless 0 0 0 0 0  PHQ - 2 Score 0 0 0 0 0    Review of Systems:   Pertinent items are noted in HPI Denies any pelvic pain, bowel changes, urinary changes.  Does have some nausea and reflux with the semiglutide.  Pertinent History Reviewed:  Reviewed past medical,surgical, social and family history.  Reviewed problem list, medications and allergies. Physical Assessment:   Vitals:   11/23/23 0836  BP: 119/60  Pulse: 78  SpO2: 99%  Weight: 178 lb (80.7 kg)  Height: 5' 7 (1.702 m)  Body mass index is 27.88  kg/m.        Physical Examination:   General appearance - well appearing, and in no distress  Mental status - alert, oriented to person, place, and time  Psych:  She has a normal mood and affect  Skin - warm and dry, normal color, no suspicious lesions noted  Chest - effort normal, all lung fields clear to auscultation bilaterally  Heart - normal rate and regular rhythm  Neck:  midline trachea, no thyromegaly or nodules  Breasts - breasts appear normal, no suspicious masses, no skin or nipple changes or  axillary nodes  Abdomen - soft, nontender, nondistended, no masses or organomegaly  Pelvic - VULVA: normal appearing vulva with no masses, tenderness or lesions   VAGINA: normal appearing vagina with normal color and discharge, no lesions   CERVIX: normal appearing cervix without discharge or lesions, no CMT  Thin prep pap is not indicated today.  Done in 2022.  UTERUS: uterus is felt to be normal size, shape, consistency and nontender   ADNEXA: No adnexal masses or tenderness noted.  Rectal - normal rectal, good sphincter tone, no masses felt.  Extremities:  No swelling or varicosities noted  Chaperone present for exam  No results found for this or any previous visit (from the past 24 hours).  Assessment & Plan:  1. Well woman exam with  routine gynecological exam (Primary) - Pap smear neg 2023 with neg HR HPV.  Will repeat next year. - Mammogram 2023.  Pt aware overdue.  Has had so many medical appointments  - Colonoscopy declined.  Cologuard negative 2023.  Repeat next year. - lab work done with PCP, Dr. Reta - vaccines reviewed/updated  2. Hormone replacement therapy (HRT) - discussed switching to micronized progesterone.  Pt comfortable with plan. - (PROMETRIUM) 100 MG capsule; Take 1 capsule (100 mg total) by mouth daily.  Dispense: 90 capsule; Refill: 3 - estradiol  (ESTRACE ) 1 MG tablet; TAKE 1 TABLET BY MOUTH DAILY  Dispense: 90 tablet; Refill: 3  3. History of HPV  infection  4. Need for influenza vaccination - Flu vaccine trivalent PF, 6mos and older(Flulaval,Afluria,Fluarix,Fluzone)  5. Anxiety - ALPRAZolam  (XANAX ) 0.5 MG tablet; 1 tab no more than twice daily as needed for anxiety.  Dispense: 30 tablet; Refill: 0  6. History of endometrial ablation   Orders Placed This Encounter  Procedures   Flu vaccine trivalent PF, 6mos and older(Flulaval,Afluria,Fluarix,Fluzone)    Meds:  Meds ordered this encounter  Medications   progesterone (PROMETRIUM) 100 MG capsule    Sig: Take 1 capsule (100 mg total) by mouth daily.    Dispense:  90 capsule    Refill:  3   estradiol  (ESTRACE ) 1 MG tablet    Sig: TAKE 1 TABLET BY MOUTH DAILY    Dispense:  90 tablet    Refill:  3   ALPRAZolam  (XANAX ) 0.5 MG tablet    Sig: 1 tab no more than twice daily as needed for anxiety.    Dispense:  30 tablet    Refill:  0    Follow-up: Return in about 1 year (around 11/22/2024).  Ronal GORMAN Pinal, MD 11/23/2023 9:26 AM

## 2024-11-25 ENCOUNTER — Ambulatory Visit (HOSPITAL_BASED_OUTPATIENT_CLINIC_OR_DEPARTMENT_OTHER): Admitting: Obstetrics & Gynecology
